# Patient Record
Sex: Female | Born: 1955
Health system: Southern US, Community
[De-identification: ages and names within clinical notes are randomized; demographics above are authoritative.]

## PROBLEM LIST (undated history)

## (undated) DIAGNOSIS — J45909 Unspecified asthma, uncomplicated: Secondary | ICD-10-CM

## (undated) DIAGNOSIS — R569 Unspecified convulsions: Secondary | ICD-10-CM

## (undated) DIAGNOSIS — T8859XA Other complications of anesthesia, initial encounter: Secondary | ICD-10-CM

## (undated) DIAGNOSIS — R002 Palpitations: Secondary | ICD-10-CM

## (undated) DIAGNOSIS — T4145XA Adverse effect of unspecified anesthetic, initial encounter: Secondary | ICD-10-CM

## (undated) HISTORY — DX: Palpitations: R00.2

## (undated) HISTORY — PX: TONSILLECTOMY: SUR1361

## (undated) HISTORY — PX: OTHER SURGICAL HISTORY: SHX169

## (undated) HISTORY — PX: CHOLECYSTECTOMY: SHX55

## (undated) HISTORY — PX: DILATION AND CURETTAGE OF UTERUS: SHX78

---

## 1998-01-01 ENCOUNTER — Other Ambulatory Visit: Admission: RE | Admit: 1998-01-01 | Discharge: 1998-01-01 | Payer: Self-pay | Admitting: Obstetrics and Gynecology

## 1998-12-17 ENCOUNTER — Other Ambulatory Visit: Admission: RE | Admit: 1998-12-17 | Discharge: 1998-12-17 | Payer: Self-pay | Admitting: Obstetrics and Gynecology

## 2000-01-04 ENCOUNTER — Other Ambulatory Visit: Admission: RE | Admit: 2000-01-04 | Discharge: 2000-01-04 | Payer: Self-pay | Admitting: Obstetrics and Gynecology

## 2000-07-13 ENCOUNTER — Encounter: Admission: RE | Admit: 2000-07-13 | Discharge: 2000-09-19 | Payer: Self-pay | Admitting: Obstetrics and Gynecology

## 2001-06-27 ENCOUNTER — Other Ambulatory Visit: Admission: RE | Admit: 2001-06-27 | Discharge: 2001-06-27 | Payer: Self-pay | Admitting: Obstetrics and Gynecology

## 2001-12-16 ENCOUNTER — Encounter: Payer: Self-pay | Admitting: Internal Medicine

## 2001-12-16 ENCOUNTER — Ambulatory Visit (HOSPITAL_COMMUNITY): Admission: RE | Admit: 2001-12-16 | Discharge: 2001-12-16 | Payer: Self-pay | Admitting: Internal Medicine

## 2001-12-17 ENCOUNTER — Inpatient Hospital Stay (HOSPITAL_COMMUNITY): Admission: EM | Admit: 2001-12-17 | Discharge: 2001-12-18 | Payer: Self-pay | Admitting: *Deleted

## 2001-12-24 ENCOUNTER — Emergency Department (HOSPITAL_COMMUNITY): Admission: EM | Admit: 2001-12-24 | Discharge: 2001-12-24 | Payer: Self-pay | Admitting: *Deleted

## 2002-01-15 ENCOUNTER — Ambulatory Visit (HOSPITAL_COMMUNITY): Admission: RE | Admit: 2002-01-15 | Discharge: 2002-01-15 | Payer: Self-pay | Admitting: Family Medicine

## 2002-02-17 ENCOUNTER — Emergency Department (HOSPITAL_COMMUNITY): Admission: EM | Admit: 2002-02-17 | Discharge: 2002-02-17 | Payer: Self-pay | Admitting: Emergency Medicine

## 2002-10-23 ENCOUNTER — Encounter: Payer: Self-pay | Admitting: Family Medicine

## 2002-10-23 ENCOUNTER — Ambulatory Visit (HOSPITAL_COMMUNITY): Admission: RE | Admit: 2002-10-23 | Discharge: 2002-10-23 | Payer: Self-pay | Admitting: Family Medicine

## 2002-11-20 ENCOUNTER — Other Ambulatory Visit: Admission: RE | Admit: 2002-11-20 | Discharge: 2002-11-20 | Payer: Self-pay | Admitting: Obstetrics and Gynecology

## 2004-02-09 ENCOUNTER — Other Ambulatory Visit: Admission: RE | Admit: 2004-02-09 | Discharge: 2004-02-09 | Payer: Self-pay | Admitting: Obstetrics and Gynecology

## 2007-06-13 ENCOUNTER — Emergency Department (HOSPITAL_COMMUNITY): Admission: EM | Admit: 2007-06-13 | Discharge: 2007-06-13 | Payer: Self-pay | Admitting: Emergency Medicine

## 2010-03-12 ENCOUNTER — Encounter: Payer: Self-pay | Admitting: Obstetrics and Gynecology

## 2010-03-14 ENCOUNTER — Encounter: Payer: Self-pay | Admitting: Obstetrics and Gynecology

## 2010-07-08 NOTE — Procedures (Signed)
   NAMEHAILLEE, JOHANN                              ACCOUNT NO.:  [id number]   MEDICAL RECORD NO.:  [id number]                   PATIENT TYPE:  OUT   LOCATION:  RESP                                 FACILITY:  [facility]   PHYSICIAN:  Edward L. Juanetta Gosling, M.D.             DATE OF BIRTH:  August 31, 1955   DATE OF PROCEDURE:  01/20/2002  DATE OF DISCHARGE:  01/15/2002                              PULMONARY FUNCTION TEST   IMPRESSION:  1. Spirometry is normal.  2. Lung volumes are normal.  3. Diffusion capacity of carbon monoxide is normal.                                               Edward L. Juanetta Gosling, M.D.    ELH/MEDQ  D:  01/20/2002  T:  01/21/2002  Job:  [id number]   cc:   Kirk Ruths, M.D.  [address]  [place]  [place] [address]  Fax: [phone]

## 2010-07-08 NOTE — Discharge Summary (Signed)
Felicia Bryant, Felicia Bryant                              ACCOUNT NO.:  [id number]   MEDICAL RECORD NO.:  [id number]                   PATIENT TYPE:  INP   LOCATION:  6524                                 FACILITY:  [facility]   PHYSICIAN:  Jackie Plum, M.D.             DATE OF BIRTH:  1955/04/26   DATE OF ADMISSION:  12/17/2001  DATE OF DISCHARGE:  12/18/2001                                 DISCHARGE SUMMARY   PRIMARY CARE PHYSICIAN:  Madelin Rear. Sherwood Gambler, M.D., in [place], [place]  [place].   DISCHARGE DIAGNOSIS:  Shortness of breath secondary to anxiety disorder;  controlled.   DISCHARGE MEDICATION:  Ativan 1 mg one half to one tablet p.o. t.i.d. p.r.n.  anxiety.   ALLERGIES:  KEFLEX and SULFA.   PROCEDURE:  None.   HISTORY OF PRESENT ILLNESS:  The patient is a 55 year old white female with  no significant past medical history who presented to the ER with shortness  of breath x3 weeks unable to take a deep breath.  Symptoms began three weeks  ago while walking and progressing to shortness of breath at rest, shortness  of breath not worse when lying down.  The patient states right foot swelling  during first episode which has now subsided.  On presentation that evening,  she felt she was going to die, the difficulty in breathing was so intense.  The patient states to be in excellent health and feels that this is not  related to underlying anxiety.  She reports history of mitral valve prolapse  with underlying palpitations but this episode is different.  No sustained  chest pain, diaphoresis, nausea.  The remainder of the review of systems is  negative.   HOSPITAL COURSE:  The patient was evaluated in the emergency department.  EKG showed normal sinus rhythm with no ectopy or acute ST changes.  Cardiac  enzymes x3 were negative on this admission.  Blood gas in the ER on room air  revealed pH 7.616, pCO2 21.9, pO2 100, bicarbonate 22.0, pCO2 23.0, 99%  saturated.  The patient was not  noted to be tachypneic during her evaluation  in the ER despite her hypocapnia.  Vital signs at the time of presentation  revealed blood pressure 138/78, pulse 95, respiratory rate 18, temperature  98.0, 100% saturated on room air.  Due to normal EKG and lack of any other  cardiac risk factors, the patient's shortness of breath was thought to be  unlikely related to cardiac cause.  She underwent spiral CT for evaluation  of PE.  CT results revealed no acute disease and no evidence of PE.  She  also underwent pulmonary function testing which showed an FVC of 3.31 being  90% of predicted, FEV1 of 2.84 at 101% of predicted, FEV 25 to 75% at 3.49  110% of predicted, FEF max 6.70 105% of predicted, FEF 25% 6.61 which is  114% of  predicted, FEF 50% 4.10 which is 92% of predicted, FEF 75% 1.58  which is 72% of predicted, and FEV1/FVC being 86 or 112% of predicted, MVV  68 which is 65% of predicted and test line for 12 seconds.  Spirometry was  found to be within normal limits and the patient did not undergo a post  bronchodilator study.  The patient did receive Ativan in the emergency  department which she states relieved her chest pain completely.  In light of  this, and the findings associated with her CT, EKG and pulmonary function  tests, it was thought the patient's chest discomfort was most likely  associated with anxiety disorder for which she has taken Xanax in the past.  Dr. Jeanie Sewer from psychiatry was consulted and evaluated the patient.  He  diagnosed her with an axis I diagnosis of panic disorder with mild  agoraphobia and prescribed Ativan as noted above.  The patient is to follow  up with [facility], appointment to be scheduled by social  worker prior to her discharge from this institution.   DISCHARGE LABORATORY DATA:  Other than as noted above, hemoglobin 15.0,  hematocrit 44.0.  Sodium 137, potassium 3.7, chloride 107, glucose 124, BUN  10.  BNP less than 30.   Urine pregnancy negative.  Creatinine 0.5.  Cholesterol 211, triglycerides 89, HDL 72, LDL 121, VLDL 18.   CONSULTATIONS:  Antonietta Breach, M.D., psychiatry.   CONDITION ON DISCHARGE:  Improved and stable.   DISPOSITION:  The patient was discharged to home.   FOLLOW UP:  The patient is to follow up with [facility],  appointment to be scheduled by social worker prior to her discharge from  [facility].      Ettrick. Christian Mate, M.D.    SMD/MEDQ  D:  12/18/2001  T:  12/19/2001  Job:  [id number]   cc:   Antonietta Breach, M.D.  [address]  [place], [place] [address]  Fax: [phone]   Madelin Rear. Sherwood Gambler, M.D.  [address]  [place]  [place] [address]  Fax: [phone]

## 2010-11-15 LAB — POCT CARDIAC MARKERS
CKMB, poc: <1 — ABNORMAL LOW
Myoglobin, poc: 57.1
Operator id: 270651

## 2010-11-15 LAB — POCT I-STAT, CHEM 8
Creatinine, Ser: 1
Hemoglobin: 13.9
Potassium: 3.7
Sodium: 139

## 2010-12-07 ENCOUNTER — Emergency Department (HOSPITAL_COMMUNITY)
Admission: EM | Admit: 2010-12-07 | Discharge: 2010-12-07 | Disposition: A | Discharge status: Home / Self Care | Payer: 59 | Attending: Emergency Medicine | Admitting: Emergency Medicine

## 2010-12-07 ENCOUNTER — Emergency Department (HOSPITAL_COMMUNITY): Payer: 59

## 2010-12-07 DIAGNOSIS — R000 Tachycardia, unspecified: Secondary | ICD-10-CM | POA: Insufficient documentation

## 2010-12-07 DIAGNOSIS — J40 Bronchitis, not specified as acute or chronic: Secondary | ICD-10-CM | POA: Insufficient documentation

## 2010-12-07 DIAGNOSIS — R002 Palpitations: Secondary | ICD-10-CM | POA: Insufficient documentation

## 2010-12-07 LAB — DIFFERENTIAL
Eosinophils Absolute: 0.2 10*3/uL (ref 0.0–0.7)
Eosinophils Relative: 3 % (ref 0–5)
Lymphs Abs: 1.9 10*3/uL (ref 0.7–4.0)
Monocytes Absolute: 0.4 10*3/uL (ref 0.1–1.0)
Monocytes Relative: 6 % (ref 3–12)

## 2010-12-07 LAB — CBC
HCT: 37.4 % (ref 36.0–46.0)
MCH: 28.5 pg (ref 26.0–34.0)
MCHC: 33.2 g/dL (ref 30.0–36.0)
MCV: 86 fL (ref 78.0–100.0)
Platelets: 332 10*3/uL (ref 150–400)
RDW: 13.1 % (ref 11.5–15.5)
WBC: 6.4 10*3/uL (ref 4.0–10.5)

## 2010-12-07 LAB — COMPREHENSIVE METABOLIC PANEL
AST: 19 U/L (ref 0–37)
Albumin: 4.2 g/dL (ref 3.5–5.2)
BUN: 14 mg/dL (ref 6–23)
Calcium: 10.1 mg/dL (ref 8.4–10.5)
Creatinine, Ser: 0.68 mg/dL (ref 0.50–1.10)
Total Protein: 7.7 g/dL (ref 6.0–8.3)

## 2010-12-07 LAB — POCT I-STAT TROPONIN I

## 2010-12-11 NOTE — Consult Note (Signed)
Felicia Bryant, Felicia Bryant                  ACCOUNT NO.:  [id number]  MEDICAL RECORD NO.:  [id number]  LOCATION:  [facility]                         FACILITY:  [facility]  PHYSICIAN:  Jake Bathe, MD      DATE OF BIRTH:  Sep 14, 1955  DATE OF CONSULTATION:  12/07/2010 DATE OF DISCHARGE:  12/07/2010                                CONSULTATION   REFERRING PHYSICIAN:  Nelva Nay, MD  REASON FOR CONSULTATION:  Consultation is at the request of Dr. Radford Pax for the evaluation of palpitations.  HISTORY OF PRESENT ILLNESS:  Ms. Felicia Bryant is a 55 year old female with prior history of PACs on Holter monitor from 2009, as well as mild diastolic dysfunction, who last night was having the sensation of palpitations, rapid heart beat.  She states that her pulse was going at 99 beats per minute.  She felt uneasy.  She denied any chest pain, shortness of breath, fevers, or chills.  She did have a mild cough and some mild trouble breathing with excess mucus in the nape of her throat. She called up to the office at [facility], and they told her to come to the emergency room for further evaluation.  Once in the emergency room, cardiac markers were drawn and were negative.  EKG was unremarkable showing sinus rhythm without any ST-segment changes and chest x-ray showed mild bronchitic changes.  White count is normal, creatinine is normal, hemoglobin is 12.4, and chest x-ray was personally reviewed.  Telemetry was also personally reviewed.  PAST MEDICAL HISTORY:  Prior palpitations as described above.  She did have a prolonged intubation at one point which she states that after that point she had heart issues or palpitations.  FAMILY HISTORY:  No early family history of CAD.  SOCIAL HISTORY:  Nonsmoker, nondrinker, and no drug use.  ALLERGIES:  BACTRIM and KEFLEX.  MEDICATIONS:  She does have an inhaler at home, most likely albuterol, otherwise no other meds.  REVIEW OF SYSTEMS:  No syncope, no chest pain, no  shortness of breath, no orthopnea, no rashes, no dysphasia.  Positive for palpitations as described above.  Unless specified above, all other 12 review of systems negative.  PHYSICAL EXAMINATION:  VITAL SIGNS:  Temperature 98.2, pulse 95, respirations 20 saturating 97% on room air, and blood pressure 145/90. GENERAL:  Alert and oriented x3 in acute distress, looking comfortable in the bed with her husband at bedside. EYES:  Well-perfused conjunctivae.  EOMI.  No scleral icterus. NECK:  Supple.  No lymphadenopathy.  No thyromegaly.  No carotid bruits. No JVD. CARDIOVASCULAR:  Regular rate and rhythm without any appreciable murmurs, rubs, or gallops (prior echo showed mild tricuspid regurgitation). ABDOMEN:  Soft, nontender, normoactive bowel sounds.  No rebound or guarding.  No tenderness. EXTREMITIES:  No clubbing, cyanosis, or edema.  Palpable distal pulses. GU:  Deferred. RECTAL:  Deferred. NEUROLOGIC:  Nonfocal. PSYCH:  Mildly anxious.  DATA:  Lab work as described above.  Chest x-ray as described above. Troponin negative.  ASSESSMENT/PLAN:  This is a 55 year old female with palpitations and bronchitis. 1. Palpitations - from prior Holter monitor from 2009, she had     demonstrated 700 premature atrial  contractions.  Benign arrhythmia.     No adverse arrhythmias identified.  No atrial fibrillation.  Most     likely, she was having some increased premature atrial contractions     as a result of increased mucus production/bronchitis.  No further     cardiac treatment warranted at this time.  If symptoms worsen, she     needs to contact me.  Prior ejection fraction reassuring. 2. Bronchitis - per Primary ED Team likely inhaler treatment.  She may follow up with her primary physician.  Her husband also sees me in the office.  Reassurance has been given to her.  Telemetry unremarkable.     Jake Bathe, MD     MCS/MEDQ  D:  12/07/2010  T:  12/07/2010  Job:   [id number]  Electronically Signed by Donato Schultz MD on 12/11/2010 07:56:11 AM

## 2012-10-12 ENCOUNTER — Encounter (HOSPITAL_COMMUNITY): Payer: Self-pay | Admitting: Emergency Medicine

## 2012-10-12 ENCOUNTER — Emergency Department (HOSPITAL_COMMUNITY): Payer: 59

## 2012-10-12 ENCOUNTER — Emergency Department (HOSPITAL_COMMUNITY)
Admission: EM | Admit: 2012-10-12 | Discharge: 2012-10-12 | Disposition: A | Discharge status: Home / Self Care | Payer: 59 | Attending: Emergency Medicine | Admitting: Emergency Medicine

## 2012-10-12 DIAGNOSIS — R238 Other skin changes: Secondary | ICD-10-CM | POA: Insufficient documentation

## 2012-10-12 DIAGNOSIS — R002 Palpitations: Secondary | ICD-10-CM

## 2012-10-12 DIAGNOSIS — IMO0002 Reserved for concepts with insufficient information to code with codable children: Secondary | ICD-10-CM | POA: Insufficient documentation

## 2012-10-12 DIAGNOSIS — J45909 Unspecified asthma, uncomplicated: Secondary | ICD-10-CM | POA: Insufficient documentation

## 2012-10-12 LAB — POCT I-STAT, CHEM 8
Calcium, Ion: 1.2 mmol/L (ref 1.12–1.23)
Creatinine, Ser: 0.9 mg/dL (ref 0.50–1.10)
Glucose, Bld: 106 mg/dL — ABNORMAL HIGH (ref 70–99)
HCT: 43 % (ref 36.0–46.0)
Hemoglobin: 14.6 g/dL (ref 12.0–15.0)
Potassium: 3.7 mEq/L (ref 3.5–5.1)

## 2012-10-12 LAB — RAPID URINE DRUG SCREEN, HOSP PERFORMED
Amphetamines: NOT DETECTED
Cocaine: NOT DETECTED
Opiates: NOT DETECTED
Tetrahydrocannabinol: NOT DETECTED

## 2012-10-12 LAB — TSH: TSH: 1.739 u[IU]/mL (ref 0.350–4.500)

## 2012-10-12 NOTE — ED Provider Notes (Signed)
CSN: [id number]     Arrival date & time 10/12/12  1050 History     First MD Initiated Contact with Patient 10/12/12 1101     Chief Complaint  Patient presents with  . Palpitations   (Consider location/radiation/quality/duration/timing/severity/associated sxs/prior Treatment) HPI  57 year old female presents complaining of heart palpitation. Patient states she has a history of asthma. She received steroid injection on occasion. Has never had a problem with it before. 2 weeks ago she received a shot of Depo-Medrol the doctor's office. Subsequently she has been experiencing intermittent bouts of heart palpitation, abnormal sensation throughout skin, and increased agitation. Symptom has been waxing and waning. It has improved however her heart palpitation was worsened last night which prompted to come to the ED today. Initially she was going to visit her Dr. but the office was busy, therefore decided to come to the ER today. She denies any fever, chills, headache, neck stiffness, vision changes, and shortness of breath, lightheadedness, dizziness, numbness weakness. She denies any rash. She denies any throat swelling or trouble swallowing, or trouble breathing. She has been seen by Dr. 2 weeks ago for this complaint. She was notified that the office has recently uses a new Depo-Medrol formulary.  Allis patient denies any significant history of heart problem, diabetes, a history of syncope. She is scheduled to follow with the cardiologist for further evaluations of the heart palpitation. Appointment is for September 8.  History reviewed. No pertinent past medical history. History reviewed. No pertinent past surgical history. History reviewed. No pertinent family history. History  Substance Use Topics  . Smoking status: Not on file  . Smokeless tobacco: Not on file  . Alcohol Use: No   OB History   Grav Para Term Preterm Abortions TAB SAB Ect Mult Living                 Review of Systems  All  other systems reviewed and are negative.    Allergies  Review of patient's allergies indicates not on file.  Home Medications  No current outpatient prescriptions on file. BP 116/75  Pulse 90  Temp(Src) 97.8 F (36.6 C) (Oral)  Resp 20  SpO2 99% Physical Exam  Nursing note and vitals reviewed. Constitutional: She is oriented to person, place, and time. She appears well-developed and well-nourished. No distress.  Awake, alert, nontoxic appearance  HENT:  Head: Atraumatic.  Mouth/Throat: Oropharynx is clear and moist.  Eyes: Conjunctivae are normal. Right eye exhibits no discharge. Left eye exhibits no discharge.  Neck: Neck supple. No tracheal deviation present. No thyromegaly present.  Cardiovascular: Normal rate, regular rhythm and intact distal pulses.  Exam reveals no gallop and no friction rub.   No murmur heard. Pulmonary/Chest: Effort normal. No respiratory distress. She exhibits no tenderness.  Abdominal: Soft. There is no tenderness. There is no rebound.  Musculoskeletal: She exhibits no edema and no tenderness.  ROM appears intact, no obvious focal weakness  Neurological: She is alert and oriented to person, place, and time.  Mental status and motor strength appears intact  Skin: No rash noted.  Psychiatric: She has a normal mood and affect.    ED Course   Procedures (including critical care time)   Date: 10/12/2012  Rate: 87  Rhythm: normal sinus rhythm  QRS Axis: normal  Intervals: normal  ST/T Wave abnormalities: normal  Conduction Disutrbances:none  Narrative Interpretation:   Old EKG Reviewed: none available    11:24 AM Pt here concerning for heart palpation and abnormal sensation  throughout her skin since receiving depo-medrol 3 weeks ago. She is non toxic in appearance, no active heart palpation or arrythmia. ECG normal.   No evidence of airway compromise or allergic reaction.  No evidence to suggest near syncope.  No active chest pain.  Care  discussed with attending.    12:51 PM EKG shows no acute changes. Chest x-rays is normal. Normal electrolytes, normal H&H, no active heart palpitation at this time. No evidence concerning for allergic reaction at this time. Patient is scheduled to follow up with her cardiologist, she may benefit from Holter monitoring outpatient. Patient will also follow with her primary care Dr. for further care. Patient states her recent lab work including electrolytes, and thyroid function panel has been normal. That was done a week ago.  Labs Reviewed  POCT I-STAT, CHEM 8 - Abnormal; Notable for the following:    Glucose, Bld 106 (*)    All other components within normal limits  TSH  URINE RAPID DRUG SCREEN (HOSP PERFORMED)   Dg Chest 2 View  10/12/2012   *RADIOLOGY REPORT*  Clinical Data: Palpitations.  Possible reaction to medication (Depo- Medrol).  CHEST - 2 VIEW  Comparison: Two-view chest x-ray 12/07/2010 and portable chest x- ray 06/13/2007.  Findings: The patient wanted to wear a thyroid shield during the examination, so the extreme medial lung apices and the upper mediastinum are excluded on the PA image.  Allowing for this, cardiomediastinal silhouette unremarkable and unchanged.  Lungs clear.  Bronchovascular markings normal.  Pulmonary vascularity normal.  No pneumothorax.  No pleural effusions.  Degenerative changes throughout the thoracic spine.  No significant interval change.  IMPRESSION: No acute cardiopulmonary disease.  Stable examination.   Original Report Authenticated By: Hulan Saas, M.D.   1. Heart palpitations     MDM  BP 116/75  Pulse 90  Temp(Src) 97.8 F (36.6 C) (Oral)  Resp 20  SpO2 99%  I have reviewed nursing notes and vital signs. I personally reviewed the imaging tests through PACS system  I reviewed available ER/hospitalization records thought the EMR    Fayrene Helper, [place] 10/12/12 1253

## 2012-10-12 NOTE — ED Notes (Signed)
Pt. Stated, i had a Depo-Medero, shot 3 weeks ago and ever since then, I've had my heart feel like its pounding and heat all over my body. My skin feels like its crawling all over.  The Dr. Ardelia Mems to see me today.

## 2012-10-12 NOTE — ED Provider Notes (Signed)
Medical screening examination/treatment/procedure(s) were performed by non-physician practitioner and as supervising physician I was immediately available for consultation/collaboration.   William Rhina Kramme, MD 10/12/12 1457 

## 2012-10-12 NOTE — ED Notes (Signed)
Pt states that she initially had a reaction at the beginning of August to Depo-medrol.  Pt did not seek treatment at that time.  Pt states that since then she has had intermittent episodes of feeling like her heart is racing and there is "acid in her veins".  Pt states she woke this morning at 4am with this feeling and sought treatment today.

## 2012-11-15 ENCOUNTER — Telehealth (HOSPITAL_COMMUNITY): Payer: Self-pay | Admitting: Dietician

## 2012-11-15 NOTE — Telephone Encounter (Signed)
Unable to fax notification. Valla Leaver at [phone] and informed her that pt has not been seen here.

## 2012-11-15 NOTE — Telephone Encounter (Signed)
There is no record of pt making an appointment to attend diabetes class. There is no record of pt attending diabetes class or nutrition appointment at [facility].

## 2012-11-15 NOTE — Telephone Encounter (Signed)
Faxed results to [facility].

## 2012-11-15 NOTE — Telephone Encounter (Signed)
Received fax from [facility] inquiring if pt has made an appointment or has been seen.

## 2013-02-18 ENCOUNTER — Emergency Department (HOSPITAL_COMMUNITY): Payer: 59

## 2013-02-18 ENCOUNTER — Encounter (HOSPITAL_COMMUNITY): Payer: Self-pay | Admitting: Emergency Medicine

## 2013-02-18 ENCOUNTER — Emergency Department (HOSPITAL_COMMUNITY)
Admission: EM | Admit: 2013-02-18 | Discharge: 2013-02-18 | Disposition: A | Discharge status: Home / Self Care | Payer: 59 | Attending: Emergency Medicine | Admitting: Emergency Medicine

## 2013-02-18 DIAGNOSIS — B9789 Other viral agents as the cause of diseases classified elsewhere: Secondary | ICD-10-CM

## 2013-02-18 DIAGNOSIS — J45901 Unspecified asthma with (acute) exacerbation: Secondary | ICD-10-CM | POA: Insufficient documentation

## 2013-02-18 DIAGNOSIS — J069 Acute upper respiratory infection, unspecified: Secondary | ICD-10-CM | POA: Insufficient documentation

## 2013-02-18 DIAGNOSIS — Z79899 Other long term (current) drug therapy: Secondary | ICD-10-CM | POA: Insufficient documentation

## 2013-02-18 HISTORY — DX: Unspecified asthma, uncomplicated: J45.909

## 2013-02-18 MED ORDER — IPRATROPIUM BROMIDE 0.02 % IN SOLN
0.5000 mg | Freq: Once | RESPIRATORY_TRACT | Status: AC
  Administered 2013-02-18: 0.5 mg via RESPIRATORY_TRACT
  Filled 2013-02-18: qty 2.5

## 2013-02-18 MED ORDER — BENZONATATE 100 MG PO CAPS
100.0000 mg | ORAL_CAPSULE | Freq: Once | ORAL | Status: AC
  Administered 2013-02-18: 100 mg via ORAL
  Filled 2013-02-18: qty 1

## 2013-02-18 MED ORDER — ALBUTEROL SULFATE (2.5 MG/3ML) 0.083% IN NEBU
5.0000 mg | INHALATION_SOLUTION | Freq: Once | RESPIRATORY_TRACT | Status: AC
  Administered 2013-02-18: 5 mg via RESPIRATORY_TRACT
  Filled 2013-02-18: qty 6

## 2013-02-18 MED ORDER — HYDROCOD POLST-CHLORPHEN POLST 10-8 MG/5ML PO LQCR
5.0000 mL | Freq: Once | ORAL | Status: DC
  Filled 2013-02-18: qty 5

## 2013-02-18 MED ORDER — BENZONATATE 100 MG PO CAPS: 100.0000 mg | ORAL_CAPSULE | Freq: Three times a day (TID) | ORAL | Status: DC

## 2013-02-18 NOTE — ED Notes (Signed)
Pt c/o URI sx and cough with body aches x 3 days; pt sts taking biaxin; pt sts hx of lung issues since sx several years ago

## 2013-02-18 NOTE — ED Notes (Signed)
Patient Declines Tussionex, states she is allergice to hydrocodone and codeine. These medications were added to pt's allergy list. EDPA made aware

## 2013-02-18 NOTE — ED Provider Notes (Signed)
CSN: [id number]     Arrival date & time 02/18/13  1828 History   First MD Initiated Contact with Patient 02/18/13 2007     Chief Complaint  Patient presents with  . URI  . Cough   (Consider location/radiation/quality/duration/timing/severity/associated sxs/prior Treatment) HPI History provided by pt.   Pt has had a cough for the past two days.  Keeping her awake at night.  Associated w/ chest congestion and SOB, that she describes as a mucous blockage of her airway when she is laying down.  She has also had subjective fever, chills, anorexia, body aches, fatigue, and nasal congestion.  She denies headache, rhinorrhea, sore throat, CP, exertional SOB, abd pain, N/V/D, urinary sx and rash.  Had similar sx earlier in the month, for which her doctor prescribed erythromycin over the phone.  At onset of current sx, he prescribed Levaquin, she took one dose but was unable to tolerate side effects, and he switched her to Biaxin today.  She has had one dose of this medication.  Has a h/o asthma.  Albuterol nebs seem to break up mucous in chest but have no improved her cough. Past Medical History  Diagnosis Date  . Asthma    History reviewed. No pertinent past surgical history. History reviewed. No pertinent family history. History  Substance Use Topics  . Smoking status: Never Smoker   . Smokeless tobacco: Not on file  . Alcohol Use: No   OB History   Grav Para Term Preterm Abortions TAB SAB Ect Mult Living                 Review of Systems  All other systems reviewed and are negative.    Allergies  Ciprofloxacin; Depo-medrol; and Keflex  Home Medications   Current Outpatient Rx  Name  Route  Sig  Dispense  Refill  . albuterol (PROVENTIL HFA;VENTOLIN HFA) 108 (90 BASE) MCG/ACT inhaler   Inhalation   Inhale 2 puffs into the lungs every 6 (six) hours as needed for wheezing.         Marland Kitchen albuterol (PROVENTIL) (5 MG/ML) 0.5% nebulizer solution   Nebulization   Take 2.5 mg by  nebulization every 6 (six) hours as needed for wheezing.         . clobetasol ointment (TEMOVATE) 0.05 %   Topical   Apply 1 application topically 2 (two) times daily.          BP 114/85  Pulse 124  Temp(Src) 99.6 F (37.6 C) (Oral)  Resp 18  Wt 199 lb 5 oz (90.408 kg)  SpO2 97% Physical Exam  Nursing note and vitals reviewed. Constitutional: She is oriented to person, place, and time. She appears well-developed and well-nourished. No distress.  HENT:  Head: Normocephalic and atraumatic.  Mouth/Throat: Oropharynx is clear and moist. No oropharyngeal exudate.  Eyes:  Normal appearance  Neck: Normal range of motion. Neck supple.  Cardiovascular: Normal rate and regular rhythm.   Pulmonary/Chest: Effort normal and breath sounds normal.  Patient w/out respiratory distress.  Speaking in full sentences.  Coughing.   Abdominal: Soft. Bowel sounds are normal. She exhibits no distension. There is no tenderness.  Musculoskeletal: Normal range of motion.  No peripheral edema or calf ttp  Lymphadenopathy:    She has no cervical adenopathy.  Neurological: She is alert and oriented to person, place, and time.  Skin: Skin is warm and dry. No rash noted.  Psychiatric: She has a normal mood and affect. Her behavior is normal.  ED Course  Procedures (including critical care time) Labs Review Labs Reviewed - No data to display Imaging Review Dg Chest 2 View (if Patient Has Fever And/or Copd)  02/18/2013   CLINICAL DATA:  Chronic cough for 3 weeks  EXAM: CHEST  2 VIEW  COMPARISON:  10/12/2012  FINDINGS: The heart size and mediastinal contours are within normal limits. Both lungs are clear. The visualized skeletal structures are unremarkable.  IMPRESSION: No active cardiopulmonary disease.   Electronically Signed   By: Esperanza Heir M.D.   On: 02/18/2013 19:45    EKG Interpretation   None       MDM   1. Viral respiratory illness    57yo F w/ asthma presents w/ respiratory  illness x 2 days.  Cough has been associated w/ SOB, that she describes as mucous blocking her airway when she is supine.  CXR neg for pna.  On exam, afebrile, non-toxic appearing, no respiratory distress, coughing, diffuse inspiratory/expiratory rhonchi.  Will treat w/ tessalon perles and a breathing treatment and then ambulate on pulse ox.  9:05 PM   Pt reports that her cough has improved and it feels as though her chest has opened up.  Repeat exam stable.  Nursing staff ambulated.  Pt did not become dyspneic, talked throughout exam, and O2 sat remained >95%.  She likely has a viral respiratory illness.  D/c'd home w/ tessalon perles.  She has an albuterol neb that she has not been using appropriately, which is likely why it has given her little relief.  Recommended rest, fluids and f/u w/ PCP and return for worsening sx.   Otilio Miu, PA-C 02/18/13 [phone]

## 2013-02-18 NOTE — ED Notes (Signed)
Respiratory at bedside.

## 2013-02-23 NOTE — ED Provider Notes (Signed)
Medical screening examination/treatment/procedure(s) were performed by non-physician practitioner and as supervising physician I was immediately available for consultation/collaboration.  EKG Interpretation   None         Orpah Greek, MD 02/23/13 [phone]

## 2014-06-11 ENCOUNTER — Emergency Department (HOSPITAL_COMMUNITY)
Admission: EM | Admit: 2014-06-11 | Discharge: 2014-06-11 | Disposition: A | Discharge status: Home / Self Care | Payer: 59 | Attending: Emergency Medicine | Admitting: Emergency Medicine

## 2014-06-11 ENCOUNTER — Encounter (HOSPITAL_COMMUNITY): Payer: Self-pay | Admitting: Emergency Medicine

## 2014-06-11 ENCOUNTER — Emergency Department (HOSPITAL_COMMUNITY): Payer: 59

## 2014-06-11 DIAGNOSIS — R002 Palpitations: Secondary | ICD-10-CM | POA: Insufficient documentation

## 2014-06-11 DIAGNOSIS — Z79899 Other long term (current) drug therapy: Secondary | ICD-10-CM | POA: Insufficient documentation

## 2014-06-11 DIAGNOSIS — J45909 Unspecified asthma, uncomplicated: Secondary | ICD-10-CM | POA: Diagnosis not present

## 2014-06-11 LAB — URINALYSIS, ROUTINE W REFLEX MICROSCOPIC
Bilirubin Urine: NEGATIVE
GLUCOSE, UA: NEGATIVE mg/dL
KETONES UR: 15 mg/dL — AB
Leukocytes, UA: NEGATIVE
Nitrite: NEGATIVE
PROTEIN: NEGATIVE mg/dL
Specific Gravity, Urine: 1.012 (ref 1.005–1.030)
Urobilinogen, UA: 0.2 mg/dL (ref 0.0–1.0)
pH: 6.5 (ref 5.0–8.0)

## 2014-06-11 LAB — URINE MICROSCOPIC-ADD ON

## 2014-06-11 LAB — I-STAT CHEM 8, ED
BUN: 14 mg/dL (ref 6–23)
CHLORIDE: 105 mmol/L (ref 96–112)
Calcium, Ion: 1.15 mmol/L (ref 1.12–1.23)
Creatinine, Ser: 0.8 mg/dL (ref 0.50–1.10)
GLUCOSE: 123 mg/dL — AB (ref 70–99)
HEMATOCRIT: 42 % (ref 36.0–46.0)
Hemoglobin: 14.3 g/dL (ref 12.0–15.0)
POTASSIUM: 3.6 mmol/L (ref 3.5–5.1)
Sodium: 139 mmol/L (ref 135–145)
TCO2: 20 mmol/L (ref 0–100)

## 2014-06-11 LAB — I-STAT TROPONIN, ED: TROPONIN I, POC: 0 ng/mL (ref 0.00–0.08)

## 2014-06-11 NOTE — ED Notes (Signed)
Jen, PA-C, at the bedside.  

## 2014-06-11 NOTE — Discharge Instructions (Signed)
Please follow up with your primary care physician in 1-2 days. If you do not have one please call the [place] number listed above. Please discontinue use of the vitamins. Please follow up with Dr. Marlou Porch to schedule a follow up appointment. Please read all discharge instructions and return precautions.   Palpitations A palpitation is the feeling that your heartbeat is irregular or is faster than normal. It may feel like your heart is fluttering or skipping a beat. Palpitations are usually not a serious problem. However, in some cases, you may need further medical evaluation. CAUSES  Palpitations can be caused by:  Smoking.  Caffeine or other stimulants, such as diet pills or energy drinks.  Alcohol.  Stress and anxiety.  Strenuous physical activity.  Fatigue.  Certain medicines.  Heart disease, especially if you have a history of irregular heart rhythms (arrhythmias), such as atrial fibrillation, atrial flutter, or supraventricular tachycardia.  An improperly working pacemaker or defibrillator. DIAGNOSIS  To find the cause of your palpitations, your health care provider will take your medical history and perform a physical exam. Your health care provider may also have you take a test called an ambulatory electrocardiogram (ECG). An ECG records your heartbeat patterns over a 24-hour period. You may also have other tests, such as:  Transthoracic echocardiogram (TTE). During echocardiography, sound waves are used to evaluate how blood flows through your heart.  Transesophageal echocardiogram (TEE).  Cardiac monitoring. This allows your health care provider to monitor your heart rate and rhythm in real time.  Holter monitor. This is a portable device that records your heartbeat and can help diagnose heart arrhythmias. It allows your health care provider to track your heart activity for several days, if needed.  Stress tests by exercise or by giving medicine that  makes the heart beat faster. TREATMENT  Treatment of palpitations depends on the cause of your symptoms and can vary greatly. Most cases of palpitations do not require any treatment other than time, relaxation, and monitoring your symptoms. Other causes, such as atrial fibrillation, atrial flutter, or supraventricular tachycardia, usually require further treatment. HOME CARE INSTRUCTIONS   Avoid:  Caffeinated coffee, tea, soft drinks, diet pills, and energy drinks.  Chocolate.  Alcohol.  Stop smoking if you smoke.  Reduce your stress and anxiety. Things that can help you relax include:  A method of controlling things in your body, such as your heartbeats, with your mind (biofeedback).  Yoga.  Meditation.  Physical activity such as swimming, jogging, or walking.  Get plenty of rest and sleep. SEEK MEDICAL CARE IF:   You continue to have a fast or irregular heartbeat beyond 24 hours.  Your palpitations occur more often. SEEK IMMEDIATE MEDICAL CARE IF:  You have chest pain or shortness of breath.  You have a severe headache.  You feel dizzy or you faint. MAKE SURE YOU:  Understand these instructions.  Will watch your condition.  Will get help right away if you are not doing well or get worse. Document Released: 02/04/2000 Document Revised: 02/11/2013 Document Reviewed: 04/07/2011 [facility] Patient Information 2015 [place], [place]. This information is not intended to replace advice given to you by your health care provider. Make sure you discuss any questions you have with your health care provider.

## 2014-06-11 NOTE — ED Notes (Signed)
ekg given to Dr. Colin Rhein

## 2014-06-11 NOTE — ED Provider Notes (Signed)
CSN: [id number]     Arrival date & time 06/11/14  0404 History   First MD Initiated Contact with Patient 06/11/14 [phone]     Chief Complaint  Patient presents with  . Palpitations     (Consider location/radiation/quality/duration/timing/severity/associated sxs/prior Treatment) HPI Comments: Patient states she noticed her heart started racing approximately 10 days ago, during stress. And she reports starting a new herbal medication. Patient endorses this feels just like her previous palpitations, saw Dr. Marlou Porch for those. Patient wore a holter monitor and was told she had PACs.   Patient is a 59 y.o. female presenting with palpitations. The history is provided by the patient.  Palpitations Palpitations quality:  Fast Onset quality:  Sudden Duration:  10 days Timing:  Sporadic Progression:  Worsening Chronicity:  Recurrent Context: not appetite suppressants, not caffeine, not illicit drugs and not stimulant use   Context comment:  Herbal vitamins Relieved by: exercise. Worsened by:  Nothing Ineffective treatments:  None tried Associated symptoms: cough   Associated symptoms: no chest pain, no chest pressure, no diaphoresis, no dizziness, no hemoptysis, no leg pain, no lower extremity edema, no nausea, no near-syncope, no orthopnea, no PND, no shortness of breath, no syncope, no vomiting and no weakness   Risk factors: no diabetes mellitus, no heart disease, no hx of atrial fibrillation, no hx of DVT, no hx of PE, no hx of thyroid disease, no hypercoagulable state, no hyperthyroidism, no OTC sinus medications and no stress     Past Medical History  Diagnosis Date  . Asthma   . Palpitations     seen Dr.Skains in 2009..holter with pac's  . Palpitations     echocardiogram in 6629 with diastolic dysfunction EF 57 %   Past Surgical History  Procedure Laterality Date  . Tonsillectomy    . Colon surgery    . Cholecystectomy     History reviewed. No pertinent family history. History   Substance Use Topics  . Smoking status: Never Smoker   . Smokeless tobacco: Not on file  . Alcohol Use: No   OB History    No data available     Review of Systems  Constitutional: Negative for diaphoresis.  Respiratory: Positive for cough. Negative for hemoptysis and shortness of breath.   Cardiovascular: Positive for palpitations. Negative for chest pain, orthopnea, leg swelling, syncope, PND and near-syncope.  Gastrointestinal: Negative for nausea and vomiting.  Neurological: Negative for dizziness and weakness.  All other systems reviewed and are negative.     Allergies  Ciprofloxacin; Codeine; Depo-medrol; Iodine; Levaquin; Percocet; Bactrim; and Keflex  Home Medications   Prior to Admission medications   Medication Sig Start Date End Date Taking? Authorizing Provider  albuterol (PROVENTIL HFA;VENTOLIN HFA) 108 (90 BASE) MCG/ACT inhaler Inhale 2 puffs into the lungs every 6 (six) hours as needed for wheezing.   Yes Historical Provider, MD  albuterol (PROVENTIL) (5 MG/ML) 0.5% nebulizer solution Take 2.5 mg by nebulization every 6 (six) hours as needed for wheezing.   Yes Historical Provider, MD  benzonatate (TESSALON) 100 MG capsule Take 1 capsule (100 mg total) by mouth every 8 (eight) hours. 02/18/13   Catherine Schinlever, PA-C   BP 122/76 mmHg  Pulse 75  Temp(Src) 97.7 F (36.5 C) (Oral)  Resp 15  Ht 5\' 5"  (1.651 m)  Wt 202 lb (91.627 kg)  BMI 33.61 kg/m2  SpO2 99% Physical Exam  Constitutional: She is oriented to person, place, and time. She appears well-developed and well-nourished. No distress.  HENT:  Head: Normocephalic and atraumatic.  Right Ear: External ear normal.  Left Ear: External ear normal.  Nose: Nose normal.  Mouth/Throat: Oropharynx is clear and moist. No oropharyngeal exudate.  Eyes: Conjunctivae are normal.  Neck: Normal range of motion. Neck supple.  No nuchal rigidity.   Cardiovascular: Normal rate, regular rhythm, normal heart sounds  and intact distal pulses.   No murmur heard. Pulmonary/Chest: Effort normal and breath sounds normal. No respiratory distress. She exhibits no tenderness.  Abdominal: Soft. There is no tenderness.  Musculoskeletal: Normal range of motion. She exhibits no edema.  Neurological: She is alert and oriented to person, place, and time.  Skin: Skin is warm and dry. She is not diaphoretic.  Psychiatric: She has a normal mood and affect.  Nursing note and vitals reviewed.   ED Course  Procedures (including critical care time) Medications - No data to display  Labs Review Labs Reviewed  URINALYSIS, ROUTINE W REFLEX MICROSCOPIC - Abnormal; Notable for the following:    Hgb urine dipstick TRACE (*)    Ketones, ur 15 (*)    All other components within normal limits  URINE MICROSCOPIC-ADD ON - Abnormal; Notable for the following:    Squamous Epithelial / LPF FEW (*)    Bacteria, UA MANY (*)    All other components within normal limits  I-STAT CHEM 8, ED - Abnormal; Notable for the following:    Glucose, Bld 123 (*)    All other components within normal limits  URINE CULTURE  I-STAT TROPOININ, ED    Imaging Review Dg Chest 2 View  06/11/2014   CLINICAL DATA:  Palpitations.  History of asthma  EXAM: CHEST  2 VIEW  COMPARISON:  02/18/2013  FINDINGS: Borderline cardiomegaly, with size likely accentuated by lower mediastinal fat pad and hypoventilation. Normal aortic and hilar contours. There is no edema, consolidation, effusion, or pneumothorax. Cholecystectomy.  No acute bony findings.  IMPRESSION: No active cardiopulmonary disease.   Electronically Signed   By: Monte Fantasia M.D.   On: 06/11/2014 04:52     EKG Interpretation   Date/Time:  Thursday June 11 2014 04:14:52 EDT Ventricular Rate:  81 PR Interval:  202 QRS Duration: 87 QT Interval:  386 QTC Calculation: 448 R Axis:   30 Text Interpretation:  Sinus rhythm Borderline prolonged PR interval Low  voltage, precordial leads No  significant change since last tracing  Confirmed by HORTON  MD, COURTNEY ([id number]) on 06/11/2014 5:16:09 AM      MDM   Final diagnoses:  Heart palpitations    Filed Vitals:   06/11/14 0530  BP: 122/76  Pulse: 75  Temp: 97.7 F (36.5 C)  Resp: 15   Afebrile, NAD, non-toxic appearing, AAOx4.  I have reviewed nursing notes, vital signs, and all appropriate lab and imaging results for this patient.  Patient is to be discharged with recommendation to follow up with PCP and cardiologist in regards to today's hospital visit. VSS, no tracheal deviation, no JVD or new murmur, RRR, breath sounds equal bilaterally, EKG without acute abnormalities, negative troponin, and negative CXR. No evidence of UTI. Pt has been advised to discontinue use of OTC herbal medications and decrease use of caffeine products and return to the ED is CP becomes exertional, associated with diaphoresis or nausea, radiates to left jaw/arm, worsens or becomes concerning in any way. Pt appears reliable for follow up and is agreeable to discharge. Patient is stable at time of discharge   Baron Sane, PA-C 06/11/14 [place]  Gwinda Passe, MD 06/11/14 2037

## 2014-06-11 NOTE — ED Notes (Signed)
Patient states she noticed her heart started racing approximately 10 days ago, during stress. And she reports starting a new herbal medication. Patient is currently alert and oriented, heart rate on telemetry monitor 87. Skin normal for ethnicity.

## 2014-06-11 NOTE — ED Notes (Signed)
Patient attempting to void with EMT assist.

## 2014-06-12 LAB — URINE CULTURE
Colony Count: NO GROWTH
Culture: NO GROWTH

## 2014-06-24 ENCOUNTER — Ambulatory Visit (INDEPENDENT_AMBULATORY_CARE_PROVIDER_SITE_OTHER): Payer: 59 | Admitting: Cardiology

## 2014-06-24 ENCOUNTER — Encounter: Payer: Self-pay | Admitting: Cardiology

## 2014-06-24 VITALS — BP 110/80 | HR 82 | Ht 65.0 in | Wt 198.0 lb

## 2014-06-24 DIAGNOSIS — R002 Palpitations: Secondary | ICD-10-CM | POA: Diagnosis not present

## 2014-06-24 NOTE — Patient Instructions (Signed)
Medication Instructions:  Your physician recommends that you continue on your current medications as directed. Please refer to the Current Medication list given to you today.  Labwork: None  Testing/Procedures: Your physician has requested that you have an echocardiogram. Echocardiography is a painless test that uses sound waves to create images of your heart. It provides your doctor with information about the size and shape of your heart and how well your heart's chambers and valves are working. This procedure takes approximately one hour. There are no restrictions for this procedure.  Follow-Up: Further follow up will be based on the results of the echocardiogram.  Thank you for choosing [facility]!!

## 2014-06-24 NOTE — Progress Notes (Signed)
Cardiology Office Note   Date:  06/24/2014   ID:  Felicia Bryant, DOB 04/13/1955, MRN [id number]  PCP:  Alonza Bogus, MD  Cardiologist:   Candee Furbish, MD       History of Present Illness: Felicia Bryant is a 59 y.o. female who presents for  Here for evaluation of palpitations. Her heart felt as though it began racing in mid April about 10 days prior to her urgent care visit on 06/11/14 during stressful situation. She tried a new herbal medication. Telemetry in urgent care was normal at heart rate of 87. EKG was unremarkable. Herbal medication was stopped. Chest x-ray was unremarkable. Hemoglobin 14. Potassium 3.6. Creatinine 0.8. She saw me in 2014 after having some breathing difficulties. Prior echocardiogram in 2009 was reassuring with normal ejection fraction.  She has felt much better since hydrating herself. No syncope. No chest pain.    Past Medical History  Diagnosis Date  . Asthma   . Palpitations     seen Dr.Skains in 2009..holter with pac's  . Palpitations     echocardiogram in 0865 with diastolic dysfunction EF 57 %    Past Surgical History  Procedure Laterality Date  . Tonsillectomy    . Colon surgery    . Cholecystectomy       Current Outpatient Prescriptions  Medication Sig Dispense Refill  . albuterol (PROVENTIL HFA;VENTOLIN HFA) 108 (90 BASE) MCG/ACT inhaler Inhale 2 puffs into the lungs every 6 (six) hours as needed for wheezing.    [place] Kitchen albuterol (PROVENTIL) (5 MG/ML) 0.5% nebulizer solution Take 2.5 mg by nebulization every 6 (six) hours as needed for wheezing.    . benzonatate (TESSALON) 100 MG capsule Take 1 capsule (100 mg total) by mouth every 8 (eight) hours. 20 capsule 0   No current facility-administered medications for this visit.    Allergies:   Ciprofloxacin; Codeine; Depo-medrol; Iodine; Levaquin; Percocet; Bactrim; and Keflex    Social History:  The patient  reports that she has never smoked. She does not have any smokeless tobacco history  on file. She reports that she does not drink alcohol or use illicit drugs.   Family History:  The patient's no early family history of coronary artery disease    ROS:  Please see the history of present illness.   Otherwise, review of systems are positive for none.   All other systems are reviewed and negative.    PHYSICAL EXAM: VS:  BP 110/80 mmHg  Pulse 82  Ht 5\' 5"  (1.651 m)  Wt 198 lb (89.812 kg)  BMI 32.95 kg/m2 , BMI Body mass index is 32.95 kg/(m^2). GEN: Well nourished, well developed, in no acute distress HEENT: normal Neck: no JVD, carotid bruits, or masses Cardiac: RRR; no murmurs, rubs, or gallops,no edema  Respiratory:  clear to auscultation bilaterally, normal work of breathing GI: soft, nontender, nondistended, + BS MS: no deformity or atrophy Skin: warm and dry, no rash Neuro:  Strength and sensation are intact Psych: euthymic mood, full affect   EKG:  EKG is ordered today. The ekg ordered today demonstrates 06/24/14-sinus rhythm, 82, no other abnormalities.   Recent Labs: 06/11/2014: BUN 14; Creatinine 0.80; Hemoglobin 14.3; Potassium 3.6; Sodium 139    Lipid Panel No results found for: CHOL, TRIG, HDL, CHOLHDL, VLDL, LDLCALC, LDLDIRECT    Wt Readings from Last 3 Encounters:  06/24/14 198 lb (89.812 kg)  06/11/14 202 lb (91.627 kg)  02/18/13 199 lb 5 oz (90.408 kg)  Other studies Reviewed: Additional studies/ records that were reviewed today include: Emergency room reports, personally viewed chest x-ray and showed to patient. Lab work.. Review of the above records demonstrates: As above   ASSESSMENT AND PLAN:  1.  Palpitations-certainly supplements could have contributed to her palpitations. Now that she is hydrating well, she is feeling less of these. She will occasionally still feel her palpitations however. No syncope. No chest pain. I will go ahead and order an echocardiogram to insure proper structure and function. There was also mention of  borderline cardiomegaly on most recent chest x-ray which is a change from prior. I think that this is mostly because of decreased inspiratory volume during chest x-ray versus actual cardiomegaly. Echocardiogram will help to confirm this.  2. Asthma-overall doing very well. She does not do well on airplanes. 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  Working with Dr. Marina Gravel.    Weight loss-would encourage her raw diet.  Current medicines are reviewed at length with the patient today.  The patient does not have concerns regarding medicines.  The following changes have been made:  no change  Labs/ tests ordered today include:   Orders Placed This Encounter  Procedures  . EKG 12-Lead  . Echocardiogram     Disposition:   FU with me on as-needed basis   Signed, Candee Furbish, MD  06/24/2014 3:40 PM    [facility] [address], [place], [place]  [address] Phone: [phone]; Fax: [phone]

## 2014-06-25 ENCOUNTER — Ambulatory Visit: Payer: 59 | Admitting: Cardiology

## 2014-06-30 ENCOUNTER — Ambulatory Visit (HOSPITAL_COMMUNITY): Payer: 59 | Attending: Cardiovascular Disease

## 2014-06-30 ENCOUNTER — Other Ambulatory Visit: Payer: Self-pay

## 2014-06-30 DIAGNOSIS — J45909 Unspecified asthma, uncomplicated: Secondary | ICD-10-CM | POA: Diagnosis not present

## 2014-06-30 DIAGNOSIS — R002 Palpitations: Secondary | ICD-10-CM | POA: Diagnosis not present

## 2014-08-05 ENCOUNTER — Ambulatory Visit: Payer: 59 | Admitting: Cardiology

## 2014-08-21 ENCOUNTER — Encounter (HOSPITAL_COMMUNITY): Payer: Self-pay | Admitting: Family Medicine

## 2014-08-21 ENCOUNTER — Emergency Department (HOSPITAL_COMMUNITY)
Admission: EM | Admit: 2014-08-21 | Discharge: 2014-08-21 | Disposition: A | Discharge status: Home / Self Care | Payer: 59 | Attending: Emergency Medicine | Admitting: Emergency Medicine

## 2014-08-21 DIAGNOSIS — J45909 Unspecified asthma, uncomplicated: Secondary | ICD-10-CM | POA: Insufficient documentation

## 2014-08-21 DIAGNOSIS — Z87440 Personal history of urinary (tract) infections: Secondary | ICD-10-CM | POA: Insufficient documentation

## 2014-08-21 DIAGNOSIS — Z79899 Other long term (current) drug therapy: Secondary | ICD-10-CM | POA: Insufficient documentation

## 2014-08-21 DIAGNOSIS — R112 Nausea with vomiting, unspecified: Secondary | ICD-10-CM | POA: Insufficient documentation

## 2014-08-21 DIAGNOSIS — R300 Dysuria: Secondary | ICD-10-CM

## 2014-08-21 LAB — BASIC METABOLIC PANEL
Anion gap: 14 (ref 5–15)
BUN: 9 mg/dL (ref 6–20)
CO2: 20 mmol/L — ABNORMAL LOW (ref 22–32)
CREATININE: 0.88 mg/dL (ref 0.44–1.00)
Calcium: 9 mg/dL (ref 8.9–10.3)
Chloride: 100 mmol/L — ABNORMAL LOW (ref 101–111)
GFR calc Af Amer: >60 mL/min (ref >60)
GFR calc non Af Amer: >60 mL/min (ref >60)
Glucose, Bld: 137 mg/dL — ABNORMAL HIGH (ref 65–99)
Potassium: 3.5 mmol/L (ref 3.5–5.1)
Sodium: 134 mmol/L — ABNORMAL LOW (ref 135–145)

## 2014-08-21 LAB — URINALYSIS, ROUTINE W REFLEX MICROSCOPIC
Bilirubin Urine: NEGATIVE
GLUCOSE, UA: NEGATIVE mg/dL
Ketones, ur: >80 mg/dL — AB
Leukocytes, UA: NEGATIVE
Nitrite: NEGATIVE
Protein, ur: NEGATIVE mg/dL
SPECIFIC GRAVITY, URINE: 1.014 (ref 1.005–1.030)
Urobilinogen, UA: 0.2 mg/dL (ref 0.0–1.0)
pH: 5 (ref 5.0–8.0)

## 2014-08-21 LAB — CBC WITH DIFFERENTIAL/PLATELET
Basophils Absolute: 0 10*3/uL (ref 0.0–0.1)
Basophils Relative: 0 % (ref 0–1)
EOS PCT: 0 % (ref 0–5)
Eosinophils Absolute: 0 10*3/uL (ref 0.0–0.7)
HCT: 39.3 % (ref 36.0–46.0)
Hemoglobin: 13.3 g/dL (ref 12.0–15.0)
LYMPHS ABS: 0.4 10*3/uL — AB (ref 0.7–4.0)
LYMPHS PCT: 4 % — AB (ref 12–46)
MCH: 29.2 pg (ref 26.0–34.0)
MCHC: 33.8 g/dL (ref 30.0–36.0)
MCV: 86.2 fL (ref 78.0–100.0)
Monocytes Absolute: 0.3 10*3/uL (ref 0.1–1.0)
Monocytes Relative: 3 % (ref 3–12)
NEUTROS ABS: 7.7 10*3/uL (ref 1.7–7.7)
Neutrophils Relative %: 93 % — ABNORMAL HIGH (ref 43–77)
Platelets: 267 10*3/uL (ref 150–400)
RBC: 4.56 MIL/uL (ref 3.87–5.11)
RDW: 12.9 % (ref 11.5–15.5)
WBC: 8.4 10*3/uL (ref 4.0–10.5)

## 2014-08-21 LAB — URINE MICROSCOPIC-ADD ON

## 2014-08-21 MED ORDER — SODIUM CHLORIDE 0.9 % IV BOLUS (SEPSIS)
1000.0000 mL | Freq: Once | INTRAVENOUS | Status: AC
  Administered 2014-08-21: 1000 mL via INTRAVENOUS

## 2014-08-21 MED ORDER — ONDANSETRON HCL 4 MG/2ML IJ SOLN
4.0000 mg | Freq: Once | INTRAMUSCULAR | Status: AC
  Administered 2014-08-21: 4 mg via INTRAVENOUS
  Filled 2014-08-21: qty 2

## 2014-08-21 MED ORDER — PHENAZOPYRIDINE HCL 200 MG PO TABS: 200.0000 mg | ORAL_TABLET | Freq: Three times a day (TID) | ORAL | Status: DC

## 2014-08-21 MED ORDER — ONDANSETRON 4 MG PO TBDP: 4.0000 mg | ORAL_TABLET | Freq: Three times a day (TID) | ORAL | Status: DC | PRN

## 2014-08-21 MED ORDER — ACETAMINOPHEN 650 MG RE SUPP
650.0000 mg | Freq: Once | RECTAL | Status: AC
  Administered 2014-08-21: 650 mg via RECTAL
  Filled 2014-08-21: qty 1

## 2014-08-21 MED ORDER — KETOROLAC TROMETHAMINE 30 MG/ML IJ SOLN
30.0000 mg | Freq: Once | INTRAMUSCULAR | Status: DC
  Filled 2014-08-21: qty 1

## 2014-08-21 NOTE — ED Notes (Signed)
Pt here for vomiting. Currently being treated for UTI; started antibiotic(Macrobid) last night. After taking it felt nauseated and numb in the hands, feet, and face.

## 2014-08-21 NOTE — Discharge Instructions (Signed)
Recommend to stop your antibiotics at this time. Take the prescribed medication as directed.  Pyridium will help with dysuria (burning sensation) but it will turn your urine orange/red which is normal. Urine has been sent for culture, you will be contacted if results are abnormal. Make sure to drink plenty of fluids.  May need to start with bland diet and progress back to normal as tolerated. Follow-up with your primary care physician. Return to the ED for new or worsening symptoms.

## 2014-08-21 NOTE — ED Provider Notes (Signed)
CSN: [id number]     Arrival date & time 08/21/14  1032 History   First MD Initiated Contact with Patient 08/21/14 1036     Chief Complaint  Patient presents with  . Urinary Tract Infection     (Consider location/radiation/quality/duration/timing/severity/associated sxs/prior Treatment) Patient is a 59 y.o. female presenting with urinary tract infection. The history is provided by the patient and medical records.  Urinary Tract Infection Associated symptoms: nausea and vomiting     This is a 59 year old female with past medical history significant for asthma, palpitations, presenting to the ED for nausea and vomiting. Patient states she is currently being treated for a UTI with Macrobid. She states after taking the first dose of medication she had some mild nausea but thought it was because she did on empty stomach. States she ate some green beans this morning with her second dose of medication and began vomiting afterwards.  She continues to endorse some dysuria. No hematuria. No abdominal or flank pain. Endorses chills and subjective fever.  Patient has never taken macrobid in the past.  Does have known allergies to ciprofloxacin, levofloxacin, codeine, bactrim, keflex.  VSS.  Past Medical History  Diagnosis Date  . Asthma   . Palpitations     seen Dr.Skains in 2009..holter with pac's  . Palpitations     echocardiogram in 1607 with diastolic dysfunction EF 57 %   Past Surgical History  Procedure Laterality Date  . Tonsillectomy    . Cholecystectomy     Family History  Problem Relation Age of Onset  . Heart attack Father   . Stroke Neg Hx    History  Substance Use Topics  . Smoking status: Never Smoker   . Smokeless tobacco: Not on file  . Alcohol Use: No   OB History    No data available     Review of Systems  Gastrointestinal: Positive for nausea and vomiting.  All other systems reviewed and are negative.      Allergies  Ciprofloxacin; Codeine; Depo-medrol;  Iodine; Levaquin; Percocet; Bactrim; and Keflex  Home Medications   Prior to Admission medications   Medication Sig Start Date End Date Taking? Authorizing Provider  albuterol (PROVENTIL HFA;VENTOLIN HFA) 108 (90 BASE) MCG/ACT inhaler Inhale 2 puffs into the lungs every 6 (six) hours as needed for wheezing.   Yes Historical Provider, MD  albuterol (PROVENTIL) (5 MG/ML) 0.5% nebulizer solution Take 2.5 mg by nebulization every 6 (six) hours as needed for wheezing.   Yes Historical Provider, MD  COD LIVER OIL PO Take 1 Dose by mouth daily.   Yes Historical Provider, MD  nitrofurantoin, macrocrystal-monohydrate, (MACROBID) 100 MG capsule Take 100 mg by mouth 2 (two) times daily. 7 day supply started evening of 6/30 08/20/14  Yes Historical Provider, MD   BP 126/64 mmHg  Pulse 105  Temp(Src) 99 F (37.2 C) (Oral)  Resp 16  Ht 5\' 6"  (1.676 m)  Wt 188 lb (85.276 kg)  BMI 30.36 kg/m2  SpO2 99%   Physical Exam  Constitutional: She is oriented to person, place, and time. She appears well-developed and well-nourished. No distress.  HENT:  Head: Normocephalic and atraumatic.  Mouth/Throat: Oropharynx is clear and moist.  Eyes: Conjunctivae and EOM are normal. Pupils are equal, round, and reactive to light.  Neck: Normal range of motion. Neck supple.  Cardiovascular: Normal rate, regular rhythm and normal heart sounds.   Pulmonary/Chest: Effort normal and breath sounds normal. No respiratory distress. She has no wheezes.  Abdominal: Soft.  Bowel sounds are normal. There is no tenderness. There is no guarding and no CVA tenderness.  Musculoskeletal: Normal range of motion.  Neurological: She is alert and oriented to person, place, and time.  Skin: Skin is warm and dry. She is not diaphoretic.  Psychiatric: She has a normal mood and affect.  Nursing note and vitals reviewed.   ED Course  Procedures (including critical care time) Labs Review Labs Reviewed  CBC WITH DIFFERENTIAL/PLATELET -  Abnormal; Notable for the following:    Neutrophils Relative % 93 (*)    Lymphocytes Relative 4 (*)    Lymphs Abs 0.4 (*)    All other components within normal limits  BASIC METABOLIC PANEL - Abnormal; Notable for the following:    Sodium 134 (*)    Chloride 100 (*)    CO2 20 (*)    Glucose, Bld 137 (*)    All other components within normal limits  URINALYSIS, ROUTINE W REFLEX MICROSCOPIC (NOT AT [facility]) - Abnormal; Notable for the following:    APPearance CLOUDY (*)    Hgb urine dipstick SMALL (*)    Ketones, ur >80 (*)    All other components within normal limits  URINE MICROSCOPIC-ADD ON - Abnormal; Notable for the following:    Squamous Epithelial / LPF FEW (*)    All other components within normal limits  URINE CULTURE    Imaging Review No results found.   EKG Interpretation None      MDM   Final diagnoses:  Nausea and vomiting, vomiting of unspecified type  Dysuria   59 year old female here with nausea and vomiting. She is currently being treatedfor UTI with Macrobid which she has never taken in the past. Nausea vomiting did not begin until starting this medication yesterday. Patient afebrile, nontoxic. Her abdominal exam is benign. She reports some dysuria, but denies flank pain.  Labwork was obtained which is reassuring-- bicarbonate minimally low which is likely due to vomiting. UA with ketones, but overall appears noninfectious. Will send for culture. Patient was given a liter of fluids and Zofran, she reports continued nausea but she has not had any further vomiting. She now informs me that last week she thinks she ate a "bad salad". She maintains that she did not have any nausea or vomiting until taking the Macrobid. She is also recently switched to a "low-carb" diet in an attempt to lose weight. Will give additional fluids and second round of Zofran and monitor symptoms.   2:39 PM After second liter of fluids and second dose of zofran, patient feeling much better.   She is easily tolerating PO fluids at this time.  Her vital signs remain stable on room air and she is in no acute distress. Abdominal exam remains benign.  N/V may be multi-factorial-- new abx, change in diet, and/or bad salad, irregardless symptoms are well controlled at this time.  Will d/c home with supportive care.  Rx pyridium for dysuria and zofran for N/V. Stop abx for now pending urine culture.  Patient to FU with PCP.  Encouraged fluids, gentle diet and progress back to normal as tolerated.  Discussed plan with patient, he/she acknowledged understanding and agreed with plan of care.  Return precautions given for new or worsening symptoms.  Case discussed with attending physician, Dr. Audie Pinto, agrees with assessment and plan of care.  Larene Pickett, PA-C 08/21/14 [place], MD 08/25/14 [phone]

## 2014-08-23 LAB — URINE CULTURE

## 2014-11-24 NOTE — Patient Instructions (Signed)
Felicia Bryant  11/24/2014     @PREFPERIOPPHARMACY @   Your procedure is scheduled on 11/30/2014.  Report to [facility] at 10:50 A.M.  Call this number if you have problems the morning of surgery:  [phone]   Remember:  Do not eat food or drink liquids after midnight.  Take these medicines the morning of surgery with A SIP OF WATER albuterol inhaler (bring with you to hospital), zofran   Do not wear jewelry, make-up or nail polish.  Do not wear lotions, powders, or perfumes.  You may wear deodorant.  Do not shave 48 hours prior to surgery.  Men may shave face and neck.  Do not bring valuables to the hospital.  [facility] is not responsible for any belongings or valuables.  Contacts, dentures or bridgework may not be worn into surgery.  Leave your suitcase in the car.  After surgery it may be brought to your room.  For patients admitted to the hospital, discharge time will be determined by your treatment team.  Patients discharged the day of surgery will not be allowed to drive home.    Please read over the following fact sheets that you were given. Anesthesia Post-op Instructions     PATIENT INSTRUCTIONS POST-ANESTHESIA  IMMEDIATELY FOLLOWING SURGERY:  Do not drive or operate machinery for the first twenty four hours after surgery.  Do not make any important decisions for twenty four hours after surgery or while taking narcotic pain medications or sedatives.  If you develop intractable nausea and vomiting or a severe headache please notify your doctor immediately.  FOLLOW-UP:  Please make an appointment with your surgeon as instructed. You do not need to follow up with anesthesia unless specifically instructed to do so.  WOUND CARE INSTRUCTIONS (if applicable):  Keep a dry clean dressing on the anesthesia/puncture wound site if there is drainage.  Once the wound has quit draining you may leave it open to air.  Generally you should leave the bandage intact for twenty four  hours unless there is drainage.  If the epidural site drains for more than 36-48 hours please call the anesthesia department.  QUESTIONS?:  Please feel free to call your physician or the hospital operator if you have any questions, and they will be happy to assist you.      Cataract Surgery  A cataract is a clouding of the lens of the eye. When a lens becomes cloudy, vision is reduced based on the degree and nature of the clouding. Surgery may be needed to improve vision. Surgery removes the cloudy lens and usually replaces it with a substitute lens (intraocular lens, IOL). LET YOUR EYE DOCTOR KNOW ABOUT:  Allergies to food or medicine.  Medicines taken including herbs, eye drops, over-the-counter medicines, and creams.  Use of steroids (by mouth or creams).  Previous problems with anesthetics or numbing medicine.  History of bleeding problems or blood clots.  Previous surgery.  Other health problems, including diabetes and kidney problems.  Possibility of pregnancy, if this applies. RISKS AND COMPLICATIONS  Infection.  Inflammation of the eyeball (endophthalmitis) that can spread to both eyes (sympathetic ophthalmia).  Poor wound healing.  If an IOL is inserted, it can later fall out of proper position. This is very uncommon.  Clouding of the part of your eye that holds an IOL in place. This is called an "after-cataract." These are uncommon but easily treated. BEFORE THE PROCEDURE  Do not eat or drink anything except small amounts of water  for 8 to 12 before your surgery, or as directed by your caregiver.  Unless you are told otherwise, continue any eye drops you have been prescribed.  Talk to your primary caregiver about all other medicines that you take (both prescription and nonprescription). In some cases, you may need to stop or change medicines near the time of your surgery. This is most important if you are taking blood-thinning medicine.Do not stop medicines unless  you are told to do so.  Arrange for someone to drive you to and from the procedure.  Do not put contact lenses in either eye on the day of your surgery. PROCEDURE There is more than one method for safely removing a cataract. Your doctor can explain the differences and help determine which is best for you. Phacoemulsification surgery is the most common form of cataract surgery.  An injection is given behind the eye or eye drops are given to make this a painless procedure.  A small cut (incision) is made on the edge of the clear, dome-shaped surface that covers the front of the eye (cornea).  A tiny probe is painlessly inserted into the eye. This device gives off ultrasound waves that soften and break up the cloudy center of the lens. This makes it easier for the cloudy lens to be removed by suction.  An IOL may be implanted.  The normal lens of the eye is covered by a clear capsule. Part of that capsule is intentionally left in the eye to support the IOL.  Your surgeon may or may not use stitches to close the incision. There are other forms of cataract surgery that require a larger incision and stitches to close the eye. This approach is taken in cases where the doctor feels that the cataract cannot be easily removed using phacoemulsification. AFTER THE PROCEDURE  When an IOL is implanted, it does not need care. It becomes a permanent part of your eye and cannot be seen or felt.  Your doctor will schedule follow-up exams to check on your progress.  Review your other medicines with your doctor to see which can be resumed after surgery.  Use eye drops or take medicine as prescribed by your doctor. Document Released: 01/26/2011 Document Revised: 06/23/2013 Document Reviewed: 01/26/2011 [facility] Patient Information 2015 [place], [place]. This information is not intended to replace advice given to you by your health care provider. Make sure you discuss any questions you have with your health  care provider.

## 2014-11-26 ENCOUNTER — Encounter (HOSPITAL_COMMUNITY): Payer: Self-pay

## 2014-11-26 ENCOUNTER — Encounter (HOSPITAL_COMMUNITY)
Admission: RE | Admit: 2014-11-26 | Discharge: 2014-11-26 | Disposition: A | Discharge status: Home / Self Care | Payer: 59 | Source: Ambulatory Visit | Attending: Ophthalmology | Admitting: Ophthalmology

## 2014-11-26 DIAGNOSIS — H2511 Age-related nuclear cataract, right eye: Secondary | ICD-10-CM | POA: Insufficient documentation

## 2014-11-26 DIAGNOSIS — Z01818 Encounter for other preprocedural examination: Secondary | ICD-10-CM | POA: Diagnosis present

## 2014-11-26 HISTORY — DX: Adverse effect of unspecified anesthetic, initial encounter: T41.45XA

## 2014-11-26 HISTORY — DX: Unspecified convulsions: R56.9

## 2014-11-26 HISTORY — DX: Other complications of anesthesia, initial encounter: T88.59XA

## 2014-11-26 LAB — CBC WITH DIFFERENTIAL/PLATELET
Basophils Absolute: 0 10*3/uL (ref 0.0–0.1)
Basophils Relative: 1 %
Eosinophils Absolute: 0.2 10*3/uL (ref 0.0–0.7)
Eosinophils Relative: 3 %
HEMATOCRIT: 39.9 % (ref 36.0–46.0)
HEMOGLOBIN: 13.4 g/dL (ref 12.0–15.0)
LYMPHS ABS: 3.3 10*3/uL (ref 0.7–4.0)
Lymphocytes Relative: 43 %
MCH: 29.5 pg (ref 26.0–34.0)
MCHC: 33.6 g/dL (ref 30.0–36.0)
MCV: 87.7 fL (ref 78.0–100.0)
Monocytes Absolute: 0.5 10*3/uL (ref 0.1–1.0)
Monocytes Relative: 6 %
NEUTROS ABS: 3.6 10*3/uL (ref 1.7–7.7)
NEUTROS PCT: 47 %
Platelets: 308 10*3/uL (ref 150–400)
RBC: 4.55 MIL/uL (ref 3.87–5.11)
RDW: 13.9 % (ref 11.5–15.5)
WBC: 7.6 10*3/uL (ref 4.0–10.5)

## 2014-11-26 LAB — BASIC METABOLIC PANEL
Anion gap: 10 (ref 5–15)
BUN: 18 mg/dL (ref 6–20)
CHLORIDE: 103 mmol/L (ref 101–111)
CO2: 24 mmol/L (ref 22–32)
CREATININE: 0.75 mg/dL (ref 0.44–1.00)
Calcium: 9.6 mg/dL (ref 8.9–10.3)
GFR calc Af Amer: >60 mL/min (ref >60)
GFR calc non Af Amer: >60 mL/min (ref >60)
Glucose, Bld: 110 mg/dL — ABNORMAL HIGH (ref 65–99)
Potassium: 4.2 mmol/L (ref 3.5–5.1)
Sodium: 137 mmol/L (ref 135–145)

## 2014-11-27 MED ORDER — LIDOCAINE HCL (PF) 1 % IJ SOLN
INTRAMUSCULAR | Status: AC
  Filled 2014-11-27: qty 2

## 2014-11-27 MED ORDER — CYCLOPENTOLATE-PHENYLEPHRINE OP SOLN OPTIME - NO CHARGE
OPHTHALMIC | Status: AC
  Filled 2014-11-27: qty 2

## 2014-11-27 MED ORDER — NEOMYCIN-POLYMYXIN-DEXAMETH 3.5-10000-0.1 OP SUSP
OPHTHALMIC | Status: AC
  Filled 2014-11-27: qty 5

## 2014-11-27 MED ORDER — LIDOCAINE HCL 3.5 % OP GEL
OPHTHALMIC | Status: AC
  Filled 2014-11-27: qty 1

## 2014-11-27 MED ORDER — TETRACAINE HCL 0.5 % OP SOLN
OPHTHALMIC | Status: AC
  Filled 2014-11-27: qty 2

## 2014-11-27 MED ORDER — PHENYLEPHRINE HCL 2.5 % OP SOLN
OPHTHALMIC | Status: AC
  Filled 2014-11-27: qty 15

## 2014-11-30 ENCOUNTER — Ambulatory Visit (HOSPITAL_COMMUNITY): Payer: 59 | Admitting: Anesthesiology

## 2014-11-30 ENCOUNTER — Encounter (HOSPITAL_COMMUNITY): Payer: Self-pay | Admitting: *Deleted

## 2014-11-30 ENCOUNTER — Ambulatory Visit (HOSPITAL_COMMUNITY)
Admission: RE | Admit: 2014-11-30 | Discharge: 2014-11-30 | Disposition: A | Discharge status: Home / Self Care | Payer: 59 | Source: Ambulatory Visit | Attending: Ophthalmology | Admitting: Ophthalmology

## 2014-11-30 ENCOUNTER — Encounter (HOSPITAL_COMMUNITY)
Admission: RE | Disposition: A | Discharge status: Home / Self Care | Payer: Self-pay | Source: Ambulatory Visit | Attending: Ophthalmology

## 2014-11-30 DIAGNOSIS — H25811 Combined forms of age-related cataract, right eye: Secondary | ICD-10-CM | POA: Insufficient documentation

## 2014-11-30 HISTORY — PX: CATARACT EXTRACTION W/PHACO: SHX586

## 2014-11-30 SURGERY — PHACOEMULSIFICATION, CATARACT, WITH IOL INSERTION
Anesthesia: Monitor Anesthesia Care | Site: Eye | Laterality: Right

## 2014-11-30 MED ORDER — MIDAZOLAM HCL 2 MG/2ML IJ SOLN
1.0000 mg | INTRAMUSCULAR | Status: DC | PRN
  Administered 2014-11-30: 2 mg via INTRAVENOUS

## 2014-11-30 MED ORDER — LIDOCAINE HCL 3.5 % OP GEL: 1.0000 "application " | Freq: Once | OPHTHALMIC | Status: DC

## 2014-11-30 MED ORDER — PROVISC 10 MG/ML IO SOLN
INTRAOCULAR | Status: DC | PRN
  Administered 2014-11-30: 0.85 mL via INTRAOCULAR

## 2014-11-30 MED ORDER — FENTANYL CITRATE (PF) 100 MCG/2ML IJ SOLN
25.0000 ug | INTRAMUSCULAR | Status: AC
  Administered 2014-11-30 (×2): 25 ug via INTRAVENOUS

## 2014-11-30 MED ORDER — FENTANYL CITRATE (PF) 100 MCG/2ML IJ SOLN
INTRAMUSCULAR | Status: AC
  Filled 2014-11-30: qty 2

## 2014-11-30 MED ORDER — MIDAZOLAM HCL 2 MG/2ML IJ SOLN
INTRAMUSCULAR | Status: AC
  Filled 2014-11-30: qty 2

## 2014-11-30 MED ORDER — BSS IO SOLN
INTRAOCULAR | Status: DC | PRN
  Administered 2014-11-30: 15 mL

## 2014-11-30 MED ORDER — LIDOCAINE 3.5 % OP GEL OPTIME - NO CHARGE
OPHTHALMIC | Status: DC | PRN
  Administered 2014-11-30: 1 [drp] via OPHTHALMIC

## 2014-11-30 MED ORDER — EPINEPHRINE HCL 1 MG/ML IJ SOLN
INTRAMUSCULAR | Status: AC
  Filled 2014-11-30: qty 1

## 2014-11-30 MED ORDER — CYCLOPENTOLATE-PHENYLEPHRINE 0.2-1 % OP SOLN
1.0000 [drp] | OPHTHALMIC | Status: AC
  Administered 2014-11-30 (×3): 1 [drp] via OPHTHALMIC

## 2014-11-30 MED ORDER — TETRACAINE HCL 0.5 % OP SOLN
1.0000 [drp] | OPHTHALMIC | Status: AC
  Administered 2014-11-30 (×3): 1 [drp] via OPHTHALMIC

## 2014-11-30 MED ORDER — EPINEPHRINE HCL 1 MG/ML IJ SOLN
INTRAOCULAR | Status: DC | PRN
  Administered 2014-11-30: 500 mL

## 2014-11-30 MED ORDER — PHENYLEPHRINE HCL 2.5 % OP SOLN
1.0000 [drp] | OPHTHALMIC | Status: AC
  Administered 2014-11-30 (×3): 1 [drp] via OPHTHALMIC

## 2014-11-30 MED ORDER — LACTATED RINGERS IV SOLN
INTRAVENOUS | Status: DC
  Administered 2014-11-30: 13:00:00 via INTRAVENOUS

## 2014-11-30 MED ORDER — LIDOCAINE HCL (PF) 1 % IJ SOLN
INTRAMUSCULAR | Status: DC | PRN
  Administered 2014-11-30: .6 mL

## 2014-11-30 MED ORDER — POVIDONE-IODINE 5 % OP SOLN
OPHTHALMIC | Status: DC | PRN
  Administered 2014-11-30: 1 via OPHTHALMIC

## 2014-11-30 SURGICAL SUPPLY — 11 items

## 2014-11-30 NOTE — Op Note (Signed)
Date of Admission: 11/30/2014  Date of Surgery: 11/30/2014   Pre-Op Dx: Cataract Right Eye  Post-Op Dx: Senile Combined Cataract Right  Eye,  Dx Code B34.193  Surgeon: Tonny Branch, M.D.  Assistants: None  Anesthesia: Topical with MAC  Indications: Painless, progressive loss of vision with compromise of daily activities.  Surgery: Cataract Extraction with Intraocular lens Implant Right Eye  Discription: The patient had dilating drops and viscous lidocaine placed into the Right eye in the pre-op holding area. After transfer to the operating room, a time out was performed. The patient was then prepped and draped. Beginning with a 19 degree blade a paracentesis port was made at the surgeon's 2 o'clock position. The anterior chamber was then filled with 1% non-preserved lidocaine. This was followed by filling the anterior chamber with Provisc.  A 2.48mm keratome blade was used to make a clear corneal incision at the temporal limbus.  A bent cystatome needle was used to create a continuous tear capsulotomy. Hydrodissection was performed with balanced salt solution on a Fine canula. The lens nucleus was then removed using the phacoemulsification handpiece. Residual cortex was removed with the I&A handpiece. The anterior chamber and capsular bag were refilled with Provisc. A posterior chamber intraocular lens was placed into the capsular bag with it's injector. The implant was positioned with the Kuglan hook. The Provisc was then removed from the anterior chamber and capsular bag with the I&A handpiece. Stromal hydration of the main incision and paracentesis port was performed with BSS on a Fine canula. The wounds were tested for leak which was negative. The patient tolerated the procedure well. There were no operative complications. The patient was then transferred to the recovery room in stable condition.  Complications: None  Specimen: None  EBL: None  Prosthetic device: Hoya iSert 250, power 22.5  D, SN [id number].

## 2014-11-30 NOTE — Addendum Note (Signed)
Addendum  created 11/30/14 1353 by Ollen Bowl, CRNA   Modules edited: Charges VN

## 2014-11-30 NOTE — Anesthesia Postprocedure Evaluation (Signed)
  Anesthesia Post-op Note  Patient: Felicia Bryant  Procedure(s) Performed: Procedure(s) with comments: CATARACT EXTRACTION PHACO AND INTRAOCULAR LENS PLACEMENT (IOC) (Right) - CDE:10.59  Patient Location: Short Stay  Anesthesia Type:MAC  Level of Consciousness: awake, alert  and oriented  Airway and Oxygen Therapy: Patient Spontanous Breathing  Post-op Pain: none  Post-op Assessment: Post-op Vital signs reviewed, Patient's Cardiovascular Status Stable, Respiratory Function Stable, Patent Airway and No signs of Nausea or vomiting              Post-op Vital Signs: Reviewed and stable  Last Vitals:  Filed Vitals:   11/30/14 1206  BP: 131/68  Pulse: 98  Temp: 36.6 C  Resp: 18    Complications: No apparent anesthesia complications

## 2014-11-30 NOTE — Anesthesia Preprocedure Evaluation (Signed)
Anesthesia Evaluation  Patient identified by MRN, date of birth, ID band Patient awake    Reviewed: Allergy & Precautions, NPO status , Patient's Chart, lab work & pertinent test results  History of Anesthesia Complications (+) history of anesthetic complications (possible laryngeal injury post intubation ?)  Airway Mallampati: II  TM Distance: >3 FB     Dental  (+) Teeth Intact   Pulmonary asthma ,    breath sounds clear to auscultation       Cardiovascular negative cardio ROS   Rhythm:Regular Rate:Normal     Neuro/Psych Seizures -, Well Controlled,     GI/Hepatic negative GI ROS,   Endo/Other    Renal/GU      Musculoskeletal   Abdominal   Peds  Hematology   Anesthesia Other Findings   Reproductive/Obstetrics                             Anesthesia Physical Anesthesia Plan  ASA: II  Anesthesia Plan: MAC   Post-op Pain Management:    Induction: Intravenous  Airway Management Planned: Nasal Cannula  Additional Equipment:   Intra-op Plan:   Post-operative Plan:   Informed Consent: I have reviewed the patients History and Physical, chart, labs and discussed the procedure including the risks, benefits and alternatives for the proposed anesthesia with the patient or authorized representative who has indicated his/her understanding and acceptance.     Plan Discussed with:   Anesthesia Plan Comments:         Anesthesia Quick Evaluation

## 2014-11-30 NOTE — Discharge Instructions (Signed)

## 2014-11-30 NOTE — Transfer of Care (Signed)
Immediate Anesthesia Transfer of Care Note  Patient: Felicia Bryant  Procedure(s) Performed: Procedure(s) with comments: CATARACT EXTRACTION PHACO AND INTRAOCULAR LENS PLACEMENT (IOC) (Right) - CDE:10.59  Patient Location: Short Stay  Anesthesia Type:MAC  Level of Consciousness: awake  Airway & Oxygen Therapy: Patient Spontanous Breathing  Post-op Assessment: Report given to RN  Post vital signs: Reviewed  Last Vitals:  Filed Vitals:   11/30/14 1206  BP: 131/68  Pulse: 98  Temp: 36.6 C  Resp: 18    Complications: No apparent anesthesia complications

## 2014-11-30 NOTE — H&P (Signed)
I have reviewed the H&P, the patient was re-examined, and I have identified no interval changes in medical condition and plan of care since the history and physical of record  

## 2014-12-01 ENCOUNTER — Encounter (HOSPITAL_COMMUNITY): Payer: Self-pay | Admitting: Ophthalmology

## 2016-03-06 DIAGNOSIS — Z961 Presence of intraocular lens: Secondary | ICD-10-CM | POA: Diagnosis not present

## 2016-03-06 DIAGNOSIS — H25092 Other age-related incipient cataract, left eye: Secondary | ICD-10-CM | POA: Diagnosis not present

## 2016-03-06 DIAGNOSIS — H5203 Hypermetropia, bilateral: Secondary | ICD-10-CM | POA: Diagnosis not present

## 2016-04-20 DIAGNOSIS — L918 Other hypertrophic disorders of the skin: Secondary | ICD-10-CM | POA: Diagnosis not present

## 2016-04-20 DIAGNOSIS — L820 Inflamed seborrheic keratosis: Secondary | ICD-10-CM | POA: Diagnosis not present

## 2016-07-25 DIAGNOSIS — Z01419 Encounter for gynecological examination (general) (routine) without abnormal findings: Secondary | ICD-10-CM | POA: Diagnosis not present

## 2016-07-25 DIAGNOSIS — Z6824 Body mass index (BMI) 24.0-24.9, adult: Secondary | ICD-10-CM | POA: Diagnosis not present

## 2016-11-17 DIAGNOSIS — H43813 Vitreous degeneration, bilateral: Secondary | ICD-10-CM | POA: Diagnosis not present

## 2017-05-21 DIAGNOSIS — E663 Overweight: Secondary | ICD-10-CM | POA: Diagnosis not present

## 2017-05-21 DIAGNOSIS — Z1389 Encounter for screening for other disorder: Secondary | ICD-10-CM | POA: Diagnosis not present

## 2017-05-21 DIAGNOSIS — E559 Vitamin D deficiency, unspecified: Secondary | ICD-10-CM | POA: Diagnosis not present

## 2017-05-21 DIAGNOSIS — Z0000 Encounter for general adult medical examination without abnormal findings: Secondary | ICD-10-CM | POA: Diagnosis not present

## 2017-05-21 DIAGNOSIS — R002 Palpitations: Secondary | ICD-10-CM | POA: Diagnosis not present

## 2017-05-24 ENCOUNTER — Ambulatory Visit: Payer: 59 | Admitting: Cardiology

## 2017-05-24 ENCOUNTER — Encounter: Payer: Self-pay | Admitting: Cardiology

## 2017-05-24 VITALS — BP 112/72 | HR 71 | Ht 66.0 in | Wt 153.0 lb

## 2017-05-24 DIAGNOSIS — R002 Palpitations: Secondary | ICD-10-CM

## 2017-05-24 NOTE — Progress Notes (Signed)
Cardiology Office Note:    Date:  05/24/2017   ID:  Felicia Bryant, DOB Mar 03, 1955, MRN [id number]  PCP:  Hiram Comber, PA-C  Cardiologist:  No primary care provider on file.   Referring MD: Sinda Du, MD     History of Present Illness:    Felicia Bryant is a 62 y.o. female  here for the evaluation of palpitations at the request of Dr. Luan Pulling.  My last office visit was on 06/24/14 where she was describing her heart racing 10 days prior to an urgent care visit.  She was in a stressful situation.  She had tried a new herbal medication at the time.  EKG was unremarkable at urgent care.  Chest x-ray was fine, hemoglobin 14 creatinine normal.    Has had 70 pound weight loss, plant based. HR improved. BP improved. No longer prediabetic. Husband got rid. [place] diet. "What the health" documentary. Now running through [facility]  [phone]. Nutrition facts .org. Dr. Roland Earl. The daily dozen  Movie/ TV show and taxes and palps. [place]. Adrenalin. Felt increasing. Violent crazy. Skipping, went out of rhythm she said. Prayer group. Woke up felt better.  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   Her symptoms have subsided.  No strokelike symptoms, no fevers chills nausea vomiting syncope bleeding.  Past Medical History:  Diagnosis Date  . Asthma   . Complication of anesthesia    pt states i have had trouble with my throat closing after i had a tube down my throat before."  . Palpitations    seen Dr.Kelbi Renstrom in 2009..holter with pac's  . Palpitations    echocardiogram in 3976 with diastolic dysfunction EF 57 %  . Seizures ([place])    had seizure as child from vaccines and has had not seizures since was a child'.    Past Surgical History:  Procedure Laterality Date  . CATARACT EXTRACTION W/PHACO Right 11/30/2014   Procedure: CATARACT EXTRACTION PHACO AND INTRAOCULAR LENS PLACEMENT ([place]);  Surgeon: Tonny Branch, MD;  Location: [facility];  Service: Ophthalmology;  Laterality: Right;  CDE:10.59  .  CHOLECYSTECTOMY    . conization of cervix    . DILATION AND CURETTAGE OF UTERUS     episiotomy repair also.  . TONSILLECTOMY      Current Medications: Current Meds  Medication Sig  . Cholecalciferol (VITAMIN D PO) Take 1 tablet by mouth daily.  . Cyanocobalamin (VITAMIN B-12) 3000 MCG SUBL Place 1 tablet under the tongue daily.  . Vitamin Mixture (ESTER-C PO) Take 1 tablet by mouth daily.     Allergies:   Benadryl [diphenhydramine hcl (sleep)]; Ciprofloxacin; Codeine; Depo-medrol [methylprednisolone acetate]; Fluzone [flu virus vaccine]; Iodine; Levaquin [levofloxacin]; Percocet [oxycodone-acetaminophen]; Bactrim [sulfamethoxazole-trimethoprim]; and Keflex [cephalexin]   Social History   Socioeconomic History  . Marital status: Married    Spouse name: Not on file  . Number of children: Not on file  . Years of education: Not on file  . Highest education level: Not on file  Occupational History  . Not on file  Social Needs  . Financial resource strain: Not on file  . Food insecurity:    Worry: Not on file    Inability: Not on file  . Transportation needs:    Medical: Not on file    Non-medical: Not on file  Tobacco Use  . Smoking status: Never Smoker  . Smokeless tobacco: Never Used  Substance and Sexual Activity  . Alcohol use: No  . Drug use: No  . Sexual  activity: Yes    Birth control/protection: None, Post-menopausal  Lifestyle  . Physical activity:    Days per week: Not on file    Minutes per session: Not on file  . Stress: Not on file  Relationships  . Social connections:    Talks on phone: Not on file    Gets together: Not on file    Attends religious service: Not on file    Active member of club or organization: Not on file    Attends meetings of clubs or organizations: Not on file    Relationship status: Not on file  Other Topics Concern  . Not on file  Social History Narrative  . Not on file     Family History: The patient's family history  includes Heart attack in her father. There is no history of Stroke.  ROS:   Please see the history of present illness.     All other systems reviewed and are negative.  EKGs/Labs/Other Studies Reviewed:    The following studies were reviewed today: Echocardiogram 07/02/14-normal EF no other abnormalities.  EKG:  EKG is ordered today.  The ekg ordered today demonstrates 4/419 - NSR no changes.  Prior EKG from 06/24/14 personally reviewed showing normal sinus rhythm no other significant abnormalities.  Recent Labs: No results found for requested labs within last 8760 hours.  Recent Lipid Panel No results found for: CHOL, TRIG, HDL, CHOLHDL, VLDL, LDLCALC, LDLDIRECT  Physical Exam:    VS:  BP 112/72   Pulse 71   Ht 5\' 6"  (1.676 m)   Wt 153 lb (69.4 kg)   BMI 24.69 kg/m     Wt Readings from Last 3 Encounters:  05/24/17 153 lb (69.4 kg)  11/30/14 177 lb (80.3 kg)  11/26/14 177 lb 6.4 oz (80.5 kg)     GEN:  Well nourished, well developed in no acute distress HEENT: Normal NECK: No JVD; No carotid bruits LYMPHATICS: No lymphadenopathy CARDIAC: RRR, no murmurs, rubs, gallops RESPIRATORY:  Clear to auscultation without rales, wheezing or rhonchi  ABDOMEN: Soft, non-tender, non-distended MUSCULOSKELETAL:  No edema; No deformity  SKIN: Warm and dry NEUROLOGIC:  Alert and oriented x 3 PSYCHIATRIC:  Normal affect   ASSESSMENT:    1. Palpitations    PLAN:    In order of problems listed above:  Palpitations -3 years ago perhaps the herbal supplementation may have been contributing.  No high risk symptoms such as syncope or chest pain.  The seem to be brought on by an adrenaline rush with a variety of stressful factors.  We will go ahead and check an echocardiogram to ensure proper structure and function of her heart.  She did remind me that when she wore a monitor the last time the stickers burned her skin.  I mentioned to them the apple watch or cardia device.  Her husband was  familiar with the cardia device.  If symptoms worsen or become more worrisome, she knows to contact us for further evaluation.  Try to avoid excessive stressors.  No medications at this time.  Occasional palpitations described.  If atrial fibrillation present, her risk score is 1 for female.   We will let her know the results of the echo than see her back on as-needed basis.  Continue to the great work with plant-based diet.   Medication Adjustments/Labs and Tests Ordered: Current medicines are reviewed at length with the patient today.  Concerns regarding medicines are outlined above.  Orders Placed This Encounter  Procedures  .  EKG 12-Lead  . ECHOCARDIOGRAM COMPLETE   No orders of the defined types were placed in this encounter.   Signed, Candee Furbish, MD  05/24/2017 10:01 AM    [facility]

## 2017-05-24 NOTE — Patient Instructions (Signed)
Medication Instructions:  The current medical regimen is effective;  continue present plan and medications.  Testing/Procedures: Your physician has requested that you have an echocardiogram. Echocardiography is a painless test that uses sound waves to create images of your heart. It provides your doctor with information about the size and shape of your heart and how well your heart's chambers and valves are working. This procedure takes approximately one hour. There are no restrictions for this procedure.  Follow-Up: Follow up as needed after the above testing.  If you need a refill on your cardiac medications before your next appointment, please call your pharmacy.  Thank you for choosing [facility]!!     

## 2017-05-25 ENCOUNTER — Ambulatory Visit (HOSPITAL_COMMUNITY): Payer: 59 | Attending: Cardiovascular Disease

## 2017-05-25 ENCOUNTER — Other Ambulatory Visit: Payer: Self-pay

## 2017-05-25 DIAGNOSIS — J45909 Unspecified asthma, uncomplicated: Secondary | ICD-10-CM | POA: Diagnosis not present

## 2017-05-25 DIAGNOSIS — I071 Rheumatic tricuspid insufficiency: Secondary | ICD-10-CM | POA: Diagnosis not present

## 2017-05-25 DIAGNOSIS — R002 Palpitations: Secondary | ICD-10-CM | POA: Diagnosis not present

## 2017-05-29 ENCOUNTER — Telehealth: Payer: Self-pay

## 2017-05-29 NOTE — Telephone Encounter (Signed)
Notes sent to scheduling.   

## 2017-08-17 DIAGNOSIS — N760 Acute vaginitis: Secondary | ICD-10-CM | POA: Diagnosis not present

## 2017-09-10 DIAGNOSIS — N952 Postmenopausal atrophic vaginitis: Secondary | ICD-10-CM | POA: Diagnosis not present

## 2017-09-10 DIAGNOSIS — Z01419 Encounter for gynecological examination (general) (routine) without abnormal findings: Secondary | ICD-10-CM | POA: Diagnosis not present

## 2017-09-26 DIAGNOSIS — M6250 Muscle wasting and atrophy, not elsewhere classified, unspecified site: Secondary | ICD-10-CM | POA: Diagnosis not present

## 2017-09-26 DIAGNOSIS — M62838 Other muscle spasm: Secondary | ICD-10-CM | POA: Diagnosis not present

## 2017-10-10 DIAGNOSIS — M62838 Other muscle spasm: Secondary | ICD-10-CM | POA: Diagnosis not present

## 2017-10-10 DIAGNOSIS — M6250 Muscle wasting and atrophy, not elsewhere classified, unspecified site: Secondary | ICD-10-CM | POA: Diagnosis not present

## 2017-12-07 DIAGNOSIS — N950 Postmenopausal bleeding: Secondary | ICD-10-CM | POA: Diagnosis not present

## 2017-12-11 DIAGNOSIS — N950 Postmenopausal bleeding: Secondary | ICD-10-CM | POA: Diagnosis not present

## 2017-12-20 DIAGNOSIS — N950 Postmenopausal bleeding: Secondary | ICD-10-CM | POA: Diagnosis not present

## 2017-12-20 DIAGNOSIS — N840 Polyp of corpus uteri: Secondary | ICD-10-CM | POA: Diagnosis not present

## 2017-12-20 DIAGNOSIS — R9389 Abnormal findings on diagnostic imaging of other specified body structures: Secondary | ICD-10-CM | POA: Diagnosis not present

## 2018-01-09 DIAGNOSIS — H2512 Age-related nuclear cataract, left eye: Secondary | ICD-10-CM | POA: Diagnosis not present

## 2018-01-09 DIAGNOSIS — H43813 Vitreous degeneration, bilateral: Secondary | ICD-10-CM | POA: Diagnosis not present

## 2018-01-09 DIAGNOSIS — H25042 Posterior subcapsular polar age-related cataract, left eye: Secondary | ICD-10-CM | POA: Diagnosis not present

## 2018-01-25 DIAGNOSIS — C541 Malignant neoplasm of endometrium: Secondary | ICD-10-CM | POA: Diagnosis not present

## 2018-01-25 DIAGNOSIS — N950 Postmenopausal bleeding: Secondary | ICD-10-CM | POA: Diagnosis not present

## 2018-01-25 DIAGNOSIS — N840 Polyp of corpus uteri: Secondary | ICD-10-CM | POA: Diagnosis not present

## 2018-02-05 ENCOUNTER — Telehealth: Payer: Self-pay | Admitting: *Deleted

## 2018-02-05 NOTE — Telephone Encounter (Signed)
Called and spoke with the patient regarding a new patient appt. Patient was offered an appt on December 30th with Dr. Gerarda Fraction, patient declined the appt. Patient stated "Dr. Corinna Capra really wanted me to see Dr. Denman George. I have done a lot of research on Dr. Denman George and liked everything I saw. I would like to see her." Explained to the patient that Dr. Denman George first available appt would be January 3rd. Patient stated "I really want to see her, can you get me in any earlier with her at all." Explained to the patient "Dr. Denman George is in the [place], and only has one day left before the end of the year to see patients. That day is full. Sometimes we open clinic at the last minute. I can call her if this happens." Patient agreed to the above.   Called and left Santiago Glad a message that the patient is wanting to wait until January to see Dr. Denman George.

## 2018-02-06 DIAGNOSIS — Z961 Presence of intraocular lens: Secondary | ICD-10-CM | POA: Diagnosis not present

## 2018-02-06 DIAGNOSIS — H2512 Age-related nuclear cataract, left eye: Secondary | ICD-10-CM | POA: Diagnosis not present

## 2018-02-06 DIAGNOSIS — H5212 Myopia, left eye: Secondary | ICD-10-CM | POA: Diagnosis not present

## 2018-02-10 ENCOUNTER — Emergency Department (HOSPITAL_COMMUNITY)
Admission: EM | Admit: 2018-02-10 | Discharge: 2018-02-11 | Disposition: A | Discharge status: Home / Self Care | Payer: 59 | Attending: Emergency Medicine | Admitting: Emergency Medicine

## 2018-02-10 DIAGNOSIS — R1031 Right lower quadrant pain: Secondary | ICD-10-CM | POA: Insufficient documentation

## 2018-02-10 DIAGNOSIS — Z79899 Other long term (current) drug therapy: Secondary | ICD-10-CM | POA: Insufficient documentation

## 2018-02-10 DIAGNOSIS — C55 Malignant neoplasm of uterus, part unspecified: Secondary | ICD-10-CM | POA: Diagnosis not present

## 2018-02-10 DIAGNOSIS — K5730 Diverticulosis of large intestine without perforation or abscess without bleeding: Secondary | ICD-10-CM | POA: Diagnosis not present

## 2018-02-11 ENCOUNTER — Encounter (HOSPITAL_COMMUNITY): Payer: Self-pay | Admitting: Emergency Medicine

## 2018-02-11 ENCOUNTER — Other Ambulatory Visit: Payer: Self-pay

## 2018-02-11 ENCOUNTER — Emergency Department (HOSPITAL_COMMUNITY): Payer: 59

## 2018-02-11 DIAGNOSIS — K5730 Diverticulosis of large intestine without perforation or abscess without bleeding: Secondary | ICD-10-CM | POA: Diagnosis not present

## 2018-02-11 LAB — COMPREHENSIVE METABOLIC PANEL
ALT: 11 U/L (ref 0–44)
ANION GAP: 10 (ref 5–15)
AST: 21 U/L (ref 15–41)
Albumin: 4.2 g/dL (ref 3.5–5.0)
Alkaline Phosphatase: 79 U/L (ref 38–126)
BILIRUBIN TOTAL: 0.7 mg/dL (ref 0.3–1.2)
BUN: 13 mg/dL (ref 8–23)
CO2: 25 mmol/L (ref 22–32)
Calcium: 9.6 mg/dL (ref 8.9–10.3)
Chloride: 103 mmol/L (ref 98–111)
Creatinine, Ser: 0.7 mg/dL (ref 0.44–1.00)
GFR calc Af Amer: >60 mL/min (ref >60)
Glucose, Bld: 105 mg/dL — ABNORMAL HIGH (ref 70–99)
POTASSIUM: 4.1 mmol/L (ref 3.5–5.1)
Sodium: 138 mmol/L (ref 135–145)
TOTAL PROTEIN: 7.3 g/dL (ref 6.5–8.1)

## 2018-02-11 LAB — CBC WITH DIFFERENTIAL/PLATELET
Abs Immature Granulocytes: 0.01 10*3/uL (ref 0.00–0.07)
BASOS ABS: 0 10*3/uL (ref 0.0–0.1)
BASOS PCT: 1 %
EOS ABS: 0.1 10*3/uL (ref 0.0–0.5)
Eosinophils Relative: 1 %
HCT: 39.6 % (ref 36.0–46.0)
Hemoglobin: 12.9 g/dL (ref 12.0–15.0)
IMMATURE GRANULOCYTES: 0 %
Lymphocytes Relative: 36 %
Lymphs Abs: 2.8 10*3/uL (ref 0.7–4.0)
MCH: 30.1 pg (ref 26.0–34.0)
MCHC: 32.6 g/dL (ref 30.0–36.0)
MCV: 92.3 fL (ref 80.0–100.0)
MONOS PCT: 8 %
Monocytes Absolute: 0.6 10*3/uL (ref 0.1–1.0)
NEUTROS PCT: 54 %
NRBC: 0 % (ref 0.0–0.2)
Neutro Abs: 4.1 10*3/uL (ref 1.7–7.7)
PLATELETS: 262 10*3/uL (ref 150–400)
RBC: 4.29 MIL/uL (ref 3.87–5.11)
RDW: 11.8 % (ref 11.5–15.5)
WBC: 7.7 10*3/uL (ref 4.0–10.5)

## 2018-02-11 LAB — URINALYSIS, ROUTINE W REFLEX MICROSCOPIC
Bilirubin Urine: NEGATIVE
GLUCOSE, UA: NEGATIVE mg/dL
KETONES UR: 15 mg/dL — AB
Nitrite: NEGATIVE
PROTEIN: NEGATIVE mg/dL
Specific Gravity, Urine: <1.005 — ABNORMAL LOW (ref 1.005–1.030)
pH: 6.5 (ref 5.0–8.0)

## 2018-02-11 LAB — URINALYSIS, MICROSCOPIC (REFLEX)

## 2018-02-11 LAB — LIPASE, BLOOD: LIPASE: 31 U/L (ref 11–51)

## 2018-02-11 MED ORDER — BARIUM SULFATE 2.1 % PO SUSP
ORAL | Status: AC
  Filled 2018-02-11: qty 2

## 2018-02-11 NOTE — ED Triage Notes (Signed)
The patient said she started having right sided abdominal pain since Friday.  She said she just had a D & C two weeks ago and says she thinks that might have aggravated something inside.  She had the procedure due to a growth in her uterus.  She denies any other symptoms.  She is here due to the pain getting worse.

## 2018-02-11 NOTE — ED Provider Notes (Signed)
[facility] EMERGENCY DEPARTMENT Provider Note   CSN: [id number] Arrival date & time: 02/10/18  2345     History   Chief Complaint Chief Complaint  Patient presents with  . Abdominal Pain    HPI Felicia Bryant is a 62 y.o. female.  The history is provided by the patient.  Abdominal Pain    She has history of asthma and comes in complaining of right lower quadrant pain for the last 2 days.  Pain came on while she was exercising and she thought she pulled a muscle.  It was severe yesterday, seemed to get better this morning, but then got worse this evening.  There is some radiation to the back.  Pain has been as severe as 8/10, but is now 5/10.  There is no associated nausea or vomiting but there has been anorexia.  She denies constipation or diarrhea.  She denies any urinary difficulty.  She denies fever, chills, sweats.  Pain is worse when she stands up, but nothing seems to make it better.  She has not taken anything for it other than applying a heating pad.  She also relates concerned that she has recently been diagnosed with class I uterine cancer, and is scheduled to see her oncologist next week.  Past Medical History:  Diagnosis Date  . Asthma   . Complication of anesthesia    pt states i have had trouble with my throat closing after i had a tube down my throat before."  . Palpitations    seen Dr.Skains in 2009..holter with pac's  . Palpitations    echocardiogram in 6644 with diastolic dysfunction EF 57 %  . Seizures ([place])    had seizure as child from vaccines and has had not seizures since was a child'.    Patient Active Problem List   Diagnosis Date Noted  . Palpitations 05/24/2017    Past Surgical History:  Procedure Laterality Date  . CATARACT EXTRACTION W/PHACO Right 11/30/2014   Procedure: CATARACT EXTRACTION PHACO AND INTRAOCULAR LENS PLACEMENT ([place]);  Surgeon: Tonny Branch, MD;  Location: [facility];  Service: Ophthalmology;  Laterality: Right;   CDE:10.59  . CHOLECYSTECTOMY    . conization of cervix    . DILATION AND CURETTAGE OF UTERUS     episiotomy repair also.  . TONSILLECTOMY       OB History   No obstetric history on file.      Home Medications    Prior to Admission medications   Medication Sig Start Date End Date Taking? Authorizing Provider  Cholecalciferol (VITAMIN D PO) Take 1 tablet by mouth daily.    [provider]  Cyanocobalamin (VITAMIN B-12) 3000 MCG SUBL Place 1 tablet under the tongue daily.    [provider]  Vitamin Mixture (ESTER-C PO) Take 1 tablet by mouth daily.    [provider]    Family History Family History  Problem Relation Age of Onset  . Heart attack Father   . Stroke Neg Hx     Social History Social History   Tobacco Use  . Smoking status: Never Smoker  . Smokeless tobacco: Never Used  Substance Use Topics  . Alcohol use: No  . Drug use: No     Allergies   Benadryl [diphenhydramine hcl (sleep)]; Ciprofloxacin; Codeine; Depo-medrol [methylprednisolone acetate]; Fluzone [flu virus vaccine]; Iodine; Levaquin [levofloxacin]; Percocet [oxycodone-acetaminophen]; Bactrim [sulfamethoxazole-trimethoprim]; and Keflex [cephalexin]   Review of Systems Review of Systems  Gastrointestinal: Positive for abdominal pain.  All other  systems reviewed and are negative.    Physical Exam Updated Vital Signs BP (!) 116/56 (BP Location: Left Arm)   Pulse 71   Temp (!) 97.4 F (36.3 C) (Oral)   Resp 14   SpO2 100%   Physical Exam Vitals signs and nursing note reviewed.    62 year old female, resting comfortably and in no acute distress. Vital signs are normal. Oxygen saturation is 100%, which is normal. Head is normocephalic and atraumatic. PERRLA, EOMI. Oropharynx is clear. Neck is nontender and supple without adenopathy or JVD. Back is nontender and there is no CVA tenderness. Lungs are clear without rales, wheezes, or rhonchi. Chest is  nontender. Heart has regular rate and rhythm without murmur. Abdomen is soft, flat, with vague right lower quadrant tenderness.  The point that she identifies as tender seems to move.  There is no rebound or guarding.  There are no masses or hepatosplenomegaly and peristalsis is hypoactive. Extremities have no cyanosis or edema, full range of motion is present. Skin is warm and dry without rash. Neurologic: Mental status is normal, cranial nerves are intact, there are no motor or sensory deficits.  ED Treatments / Results  Labs (all labs ordered are listed, but only abnormal results are displayed) Labs Reviewed  COMPREHENSIVE METABOLIC PANEL - Abnormal; Notable for the following components:      Result Value   Glucose, Bld 105 (*)    All other components within normal limits  URINALYSIS, ROUTINE W REFLEX MICROSCOPIC - Abnormal; Notable for the following components:   Specific Gravity, Urine <1.005 (*)    Hgb urine dipstick TRACE (*)    Ketones, ur 15 (*)    Leukocytes, UA SMALL (*)    All other components within normal limits  URINALYSIS, MICROSCOPIC (REFLEX) - Abnormal; Notable for the following components:   Bacteria, UA RARE (*)    All other components within normal limits  LIPASE, BLOOD  CBC WITH DIFFERENTIAL/PLATELET   Radiology Ct Abdomen Pelvis Wo Contrast  Result Date: 02/11/2018 CLINICAL DATA:  RIGHT abdominal pain for 3 days. Uterine dilatation and curettage 2 weeks ago. EXAM: CT ABDOMEN AND PELVIS WITHOUT CONTRAST TECHNIQUE: Multidetector CT imaging of the abdomen and pelvis was performed following the standard protocol without IV contrast. COMPARISON:  None. FINDINGS: LOWER CHEST: Lung bases are clear. The visualized heart size is normal. No pericardial effusion. HEPATOBILIARY: Status post cholecystectomy. Normal non-contrast CT liver. PANCREAS: Normal. SPLEEN: Normal. ADRENALS/URINARY TRACT: Kidneys are orthotopic, demonstrating normal size and morphology. No  nephrolithiasis, hydronephrosis; limited assessment for renal masses by nonenhanced CT. The unopacified ureters are normal in course and caliber. Urinary bladder is partially distended and unremarkable. Normal adrenal glands. STOMACH/BOWEL: The stomach, small and large bowel are normal in course and caliber without inflammatory changes, sensitivity decreased by lack of enteric contrast. Moderate volume retained large bowel stool. Moderate descending and sigmoid with a few additional colonic diverticulosis scattered diverticula. Normal appendix. VASCULAR/LYMPHATIC: Aortoiliac vessels are normal in course and caliber. No lymphadenopathy by CT size criteria. REPRODUCTIVE: Normal. OTHER: No intraperitoneal free fluid or free air. MUSCULOSKELETAL: Non-acute. Moderate RIGHT hip osteoarthrosis. Advanced lower lumbar facet arthropathy. IMPRESSION: 1. Colonic diverticulosis without acute diverticulitis nor acute intra-abdominal/pelvic process. 2. Moderate volume retained large bowel stool. Electronically Signed   By: Elon Alas M.D.   On: 02/11/2018 03:06    Procedures Procedures   Medications Ordered in ED Medications  Barium Sulfate 2.1 % SUSP (has no administration in time range)     Initial  Impression / Assessment and Plan / ED Course  I have reviewed the triage vital signs and the nursing notes.  Pertinent labs & imaging results that were available during my care of the patient were reviewed by me and considered in my medical decision making (see chart for details).  Right lower quadrant pain of uncertain cause.  Consider appendicitis, diverticulitis, urinary tract infection, urolithiasis.  Exam is benign.  Will check urinalysis, screening labs and sent for CT of abdomen and pelvis.  Patient is declining any medication at this point.  Old records are reviewed, and she has no relevant past visits.  Labs are reassuring.  CT abdomen shows evidence of diverticulosis but no diverticulitis and no  acute process.  This was explained to patient.  She feels much better, but is still concerned about her cancer.  She does have follow-up with her GYN oncologist scheduled.  Recommended that she use ice and/or heat for her pain, use over-the-counter analgesics as needed.  Return precautions discussed.  Final Clinical Impressions(s) / ED Diagnoses   Final diagnoses:  RLQ abdominal pain    ED Discharge Orders    None       Delora Fuel, MD 81/85/63 [phone]

## 2018-02-11 NOTE — Discharge Instructions (Addendum)
Apply ice, heat as needed.  Take acetaminophen, ibuprofen, or naproxen as needed for pain.  Return if symptoms are getting worse.

## 2018-02-22 ENCOUNTER — Inpatient Hospital Stay: Payer: 59 | Attending: Gynecologic Oncology | Admitting: Gynecologic Oncology

## 2018-02-22 ENCOUNTER — Encounter: Payer: Self-pay | Admitting: Gynecologic Oncology

## 2018-02-22 VITALS — BP 111/59 | HR 82 | Temp 98.3°F | Resp 18 | Ht 65.5 in | Wt 153.0 lb

## 2018-02-22 DIAGNOSIS — C541 Malignant neoplasm of endometrium: Secondary | ICD-10-CM | POA: Insufficient documentation

## 2018-02-22 NOTE — Patient Instructions (Signed)
We will contact you with the results of your endometrial biopsy from today.  The options discussed include progesterone therapy with an IUD (intrauterine device), radiation therapy, or surgery including a total hysterectomy, bilateral salpingo-oophorectomy, and lymph node sampling.  Plan to follow up within one month to discuss.   Endometrial Biopsy, Care After  This sheet gives you information about how to care for yourself after your procedure. Your health care provider may also give you more specific instructions. If you have problems or questions, contact your health care provider. What can I expect after the procedure? After the procedure, it is common to have:  Mild cramping.  A small amount of vaginal bleeding for a few days. This is normal. Follow these instructions at home:   Take over-the-counter and prescription medicines only as told by your health care provider.  Do not douche, use tampons, or have sexual intercourse until your health care provider approves.  Return to your normal activities as told by your health care provider. Ask your health care provider what activities are safe for you.  Follow instructions from your health care provider about any activity restrictions, such as restrictions on strenuous exercise or heavy lifting. Contact a health care provider if:  You have heavy bleeding, or bleed for longer than 2 days after the procedure.  You have bad smelling discharge from your vagina.  You have a fever or chills.  You have a burning sensation when urinating or you have difficulty urinating.  You have severe pain in your lower abdomen. Get help right away if:  You have severe cramps in your stomach or back.  You pass large blood clots.  Your bleeding increases.  You become weak or light-headed, or you pass out. Summary  After the procedure, it is common to have mild cramping and a small amount of vaginal bleeding for a few days.  Do not douche,  use tampons, or have sexual intercourse until your health care provider approves.  Return to your normal activities as told by your health care provider. Ask your health care provider what activities are safe for you. This information is not intended to replace advice given to you by your health care provider. Make sure you discuss any questions you have with your health care provider. Document Released: 11/27/2012 Document Revised: 02/23/2016 Document Reviewed: 02/23/2016 Elsevier Interactive Patient Education  2019 [employer].

## 2018-02-22 NOTE — Progress Notes (Signed)
Consult Note: Gyn-Onc  Consult was requested by Dr. Corinna Capra for the evaluation of Felicia Bryant 63 y.o. female  CC:  Chief Complaint  Patient presents with  . Endometrial adenocarcinoma [facility])    Assessment/Plan:  Felicia. Felicia Bryant  is a 63 y.o.  year old with grade 1 endometrioid endometrial adenocarcinoma.   The patient is extraordinarily fearful of surgery, particularly general anesthesia, given her negative experience that she has had in the past which is attributed to general anesthesia and endotracheal tube.  For this reason she desires exploration of nonsurgical options for treatment of her cancer.  I outlined various treatment strategies for endometrial cancer including use of progestin therapy (open versus intrauterine device), primary radiation therapy, and surgery including either minimally invasive hysterectomy with staging versus vaginal hysterectomy without staging.  I explained that the gold standard intervention in a patient of her general health and with her diagnosis in accordance with NCCN guidelines would be a minimally invasive hysterectomy with staging.  I explained that this would be the most definitive course of action and would provide the most information regarding the need for adjuvant therapy.  All other options would sacrifice some degree of thoroughness in diagnostic or therapeutic virtue.  However it is not unreasonable to explore a trial of hormonal therapy with a progestin releasing IUD.  She seems most interested in this.  At her request we repeated the endometrial biopsy today to identify if there was any apparent residual carcinoma in the endometrium.  She will return to see me in approximately 1 month and discussed these results with the plan to move forward when she is determine how she would like to proceed.  If she proceeds with surgery we will have her meet with anesthesia prior to the surgical date so that she can discuss her concerns and reactions in the past  anesthesia.   HPI: Felicia Bryant is a 63 year old P3 who is seen in consultation at the request of Dr Corinna Capra for grade 1 endometrial cancer.  The patient had a single episode of vaginal bleeding in November 2019.  She was seen by her provider who performed a sonohysterogram which identified endometrial thickening some polyps and then proceeded to perform an in office hysteroscopy D&C and polypectomy.  The final pathology from this procedure revealed endometrioid adenocarcinoma FIGO grade 1.  The patient expressed skepticism regarding the validity of this pathology finding.  Patient is otherwise objectively somewhat healthy woman with her major past medical history including asthma and anxiety.  She has a history of obesity which is now resolved with aggressive weight loss attempts through dietary changes.  Her past surgical history is significant for a prior laparoscopic cholecystectomy performed at [facility].  This resulted in what the patient reports as persistent reactive airway disease with asthma due to a reaction to something in the endotracheal tube.  She reports that she is allergic to almost all antibiotics.  She has had 3 prior vaginal deliveries.  She has a remote history of a cervical cone biopsy.  Current Meds:  Outpatient Encounter Medications as of 02/22/2018  Medication Sig  . Cholecalciferol (VITAMIN D PO) Take 5,000 Units by mouth daily.   . Methylcobalamin 1000 MCG SUBL Place 1,000 mcg under the tongue 4 (four) times a week.  . [DISCONTINUED] albuterol (PROVENTIL) (2.5 MG/3ML) 0.083% nebulizer solution Take 2.5 mg by nebulization every 6 (six) hours as needed for wheezing or shortness of breath.  . [DISCONTINUED] Cyanocobalamin (VITAMIN B-12) 1000  MCG SUBL Place 1,000 mcg under the tongue 4 (four) times a week.    No facility-administered encounter medications on file as of 02/22/2018.     Allergy:  Allergies  Allergen Reactions  . Benadryl [Diphenhydramine Hcl  (Sleep)] Shortness Of Breath    iv  . Ciprofloxacin Other (See Comments)    Pain in stomach  . Codeine     Pt reports "It makes me having panting breathing and my feet and hands go numb"  . Depo-Medrol [Methylprednisolone Acetate] Other (See Comments)    Racing heart, and flushing  . Fluzone [Flu Virus Vaccine] Other (See Comments)    seizures  . Iodinated Diagnostic Agents     heart  . Iodine     nausea  . Levaquin [Levofloxacin]     Heart rate 120's Tendon pops  . Percocet [Oxycodone-Acetaminophen]     Pt reports "It makes me having panting breathing and my feet and hands go numb"  . Bactrim [Sulfamethoxazole-Trimethoprim] Rash  . Keflex [Cephalexin] Rash    Social Hx:   Social History   Socioeconomic History  . Marital status: Married    Spouse name: Not on file  . Number of children: Not on file  . Years of education: Not on file  . Highest education level: Not on file  Occupational History  . Not on file  Social Needs  . Financial resource strain: Not on file  . Food insecurity:    Worry: Not on file    Inability: Not on file  . Transportation needs:    Medical: Not on file    Non-medical: Not on file  Tobacco Use  . Smoking status: Never Smoker  . Smokeless tobacco: Never Used  Substance and Sexual Activity  . Alcohol use: No  . Drug use: No  . Sexual activity: Yes    Birth control/protection: None, Post-menopausal  Lifestyle  . Physical activity:    Days per week: Not on file    Minutes per session: Not on file  . Stress: Not on file  Relationships  . Social connections:    Talks on phone: Not on file    Gets together: Not on file    Attends religious service: Not on file    Active member of club or organization: Not on file    Attends meetings of clubs or organizations: Not on file    Relationship status: Not on file  . Intimate partner violence:    Fear of current or ex partner: Not on file    Emotionally abused: Not on file    Physically  abused: Not on file    Forced sexual activity: Not on file  Other Topics Concern  . Not on file  Social History Narrative  . Not on file    Past Surgical Hx:  Past Surgical History:  Procedure Laterality Date  . CATARACT EXTRACTION W/PHACO Right 11/30/2014   Procedure: CATARACT EXTRACTION PHACO AND INTRAOCULAR LENS PLACEMENT ([place]);  Surgeon: Tonny Branch, MD;  Location: [facility];  Service: Ophthalmology;  Laterality: Right;  CDE:10.59  . CHOLECYSTECTOMY    . conization of cervix    . DILATION AND CURETTAGE OF UTERUS     episiotomy repair also.  . TONSILLECTOMY      Past Medical Hx:  Past Medical History:  Diagnosis Date  . Asthma   . Complication of anesthesia    pt states i have had trouble with my throat closing after i had a tube down my throat  before."  . Palpitations    seen Dr.Skains in 2009..holter with pac's  . Palpitations    echocardiogram in 4235 with diastolic dysfunction EF 57 %  . Seizures ([place])    had seizure as child from vaccines and has had not seizures since was a child'.    Past Gynecological History:  None.  No LMP recorded. Patient is postmenopausal.  Family Hx:  Family History  Problem Relation Age of Onset  . Dementia Mother   . Heart attack Father   . Stroke Neg Hx     Review of Systems:  Constitutional  Feels well,    ENT Normal appearing ears and nares bilaterally Skin/Breast  No rash, sores, jaundice, itching, dryness Cardiovascular  No chest pain, shortness of breath, or edema  Pulmonary  No cough or wheeze.  Gastro Intestinal  No nausea, vomitting, or diarrhoea. No bright red blood per rectum, no abdominal pain, change in bowel movement, or constipation.  Genito Urinary  No frequency, urgency, dysuria, no further bleeding. Musculo Skeletal  No myalgia, arthralgia, joint swelling or pain  Neurologic  No weakness, numbness, change in gait,  Psychology  No depression, anxiety, insomnia.   Vitals:  Blood pressure (!) 111/59,  pulse 82, temperature 98.3 F (36.8 C), temperature source Oral, resp. rate 18, height 5' 5.5" (1.664 m), weight 153 lb (69.4 kg), SpO2 100 %.  Physical Exam: WD in NAD Neck  Supple NROM, without any enlargements.  Lymph Node Survey No cervical supraclavicular or inguinal adenopathy Cardiovascular  Pulse normal rate, regularity and rhythm. S1 and S2 normal.  Lungs  Clear to auscultation bilateraly, without wheezes/crackles/rhonchi. Good air movement.  Skin  No rash/lesions/breakdown  Psychiatry  Alert and oriented to person, place, and time  Abdomen  Normoactive bowel sounds, abdomen soft, non-tender and non-obese without evidence of hernia.  Back No CVA tenderness Genito Urinary  Vulva/vagina: Normal external female genitalia.   No lesions. No discharge or bleeding.  Bladder/urethra:  No lesions or masses, well supported bladder  Vagina: normal  Cervix: Normal appearing, no lesions.  Uterus:  Small, mobile, no parametrial involvement or nodularity.  Adnexa: no palpable masses. Rectal  deferred Extremities  No bilateral cyanosis, clubbing or edema.  Procedure Note:  Preop Dx: endometrial cancer Postop Dx: same Procedure: endometrial biopsy Surgeon: Dorann Ou, MD EBL: minimal Specimens: endometrial biopsy Complications: none Procedure Details: The patient provided verbal consent, timeout was performed, the cervix was visualized with a speculum.  It was grasped with a tenaculum.  An Allis finder was inserted into the cervical loss.  An endometrial Pipelle then passed to a depth of 6 cm.  A small amount of endometrial tissue was extracted through suction.  The tenaculum was removed.  The exam was hemostatic.    Thereasa Solo, MD  02/22/2018, 4:52 PM

## 2018-02-26 ENCOUNTER — Telehealth: Payer: Self-pay | Admitting: *Deleted

## 2018-02-26 ENCOUNTER — Encounter (HOSPITAL_COMMUNITY): Payer: Self-pay

## 2018-02-26 NOTE — Telephone Encounter (Signed)
Patient called and left a message stating "I have decided not to have the IUD placed. Instead I would like to have the vaginal hysterometry with the doctor would recommended with a spinal block." Message given to both [place] and [place] [facility]

## 2018-02-27 ENCOUNTER — Telehealth: Payer: Self-pay | Admitting: *Deleted

## 2018-02-27 ENCOUNTER — Encounter (HOSPITAL_COMMUNITY)
Admission: RE | Admit: 2018-02-27 | Discharge: 2018-02-27 | Disposition: A | Discharge status: Home / Self Care | Payer: 59 | Source: Ambulatory Visit | Attending: Ophthalmology | Admitting: Ophthalmology

## 2018-02-27 NOTE — Telephone Encounter (Signed)
Called and spoke with Enid Derry at [facility], patient given a medical record number #[id number]

## 2018-03-01 MED ORDER — TETRACAINE HCL 0.5 % OP SOLN
OPHTHALMIC | Status: AC
  Filled 2018-03-01: qty 4

## 2018-03-01 MED ORDER — NEOMYCIN-POLYMYXIN-DEXAMETH 3.5-10000-0.1 OP SUSP
OPHTHALMIC | Status: AC
  Filled 2018-03-01: qty 5

## 2018-03-01 MED ORDER — CYCLOPENTOLATE-PHENYLEPHRINE 0.2-1 % OP SOLN
OPHTHALMIC | Status: AC
  Filled 2018-03-01: qty 2

## 2018-03-01 MED ORDER — LIDOCAINE HCL 3.5 % OP GEL
OPHTHALMIC | Status: AC
  Filled 2018-03-01: qty 1

## 2018-03-01 MED ORDER — PHENYLEPHRINE HCL 2.5 % OP SOLN
OPHTHALMIC | Status: AC
  Filled 2018-03-01: qty 15

## 2018-03-01 MED ORDER — LIDOCAINE HCL (PF) 1 % IJ SOLN
INTRAMUSCULAR | Status: AC
  Filled 2018-03-01: qty 2

## 2018-03-04 ENCOUNTER — Encounter (HOSPITAL_COMMUNITY)
Admission: RE | Disposition: A | Discharge status: Home / Self Care | Payer: Self-pay | Source: Home / Self Care | Attending: Ophthalmology

## 2018-03-04 ENCOUNTER — Encounter (HOSPITAL_COMMUNITY): Payer: Self-pay | Admitting: *Deleted

## 2018-03-04 ENCOUNTER — Ambulatory Visit (HOSPITAL_COMMUNITY): Payer: 59 | Admitting: Anesthesiology

## 2018-03-04 ENCOUNTER — Ambulatory Visit (HOSPITAL_COMMUNITY)
Admission: RE | Admit: 2018-03-04 | Discharge: 2018-03-04 | Disposition: A | Discharge status: Home / Self Care | Payer: 59 | Attending: Ophthalmology | Admitting: Ophthalmology

## 2018-03-04 DIAGNOSIS — H2512 Age-related nuclear cataract, left eye: Secondary | ICD-10-CM | POA: Diagnosis not present

## 2018-03-04 DIAGNOSIS — J45909 Unspecified asthma, uncomplicated: Secondary | ICD-10-CM | POA: Insufficient documentation

## 2018-03-04 DIAGNOSIS — Z79899 Other long term (current) drug therapy: Secondary | ICD-10-CM | POA: Diagnosis not present

## 2018-03-04 HISTORY — PX: CATARACT EXTRACTION W/PHACO: SHX586

## 2018-03-04 SURGERY — PHACOEMULSIFICATION, CATARACT, WITH IOL INSERTION
Anesthesia: Monitor Anesthesia Care | Site: Eye | Laterality: Left

## 2018-03-04 MED ORDER — LIDOCAINE HCL 3.5 % OP GEL
1.0000 "application " | Freq: Once | OPHTHALMIC | Status: AC
  Administered 2018-03-04: 1 via OPHTHALMIC

## 2018-03-04 MED ORDER — SODIUM CHLORIDE 0.9% FLUSH
10.0000 mL | INTRAVENOUS | Status: DC | PRN
  Administered 2018-03-04 (×2): 3 mL via INTRAVENOUS

## 2018-03-04 MED ORDER — CYCLOPENTOLATE-PHENYLEPHRINE 0.2-1 % OP SOLN
1.0000 [drp] | OPHTHALMIC | Status: AC
  Administered 2018-03-04 (×3): 1 [drp] via OPHTHALMIC

## 2018-03-04 MED ORDER — BSS IO SOLN
INTRAOCULAR | Status: DC | PRN
  Administered 2018-03-04: 15 mL

## 2018-03-04 MED ORDER — LIDOCAINE HCL (PF) 1 % IJ SOLN
INTRAMUSCULAR | Status: DC | PRN
  Administered 2018-03-04: .7 mL

## 2018-03-04 MED ORDER — LACTATED RINGERS IV SOLN: INTRAVENOUS | Status: DC

## 2018-03-04 MED ORDER — MIDAZOLAM HCL 2 MG/2ML IJ SOLN
INTRAMUSCULAR | Status: DC | PRN
  Administered 2018-03-04 (×2): 1 mg via INTRAVENOUS

## 2018-03-04 MED ORDER — TETRACAINE HCL 0.5 % OP SOLN
1.0000 [drp] | OPHTHALMIC | Status: AC
  Administered 2018-03-04 (×3): 1 [drp] via OPHTHALMIC

## 2018-03-04 MED ORDER — PROVISC 10 MG/ML IO SOLN
INTRAOCULAR | Status: DC | PRN
  Administered 2018-03-04: 0.85 mL via INTRAOCULAR

## 2018-03-04 MED ORDER — MIDAZOLAM HCL 2 MG/2ML IJ SOLN
INTRAMUSCULAR | Status: AC
  Filled 2018-03-04: qty 2

## 2018-03-04 MED ORDER — PHENYLEPHRINE HCL 2.5 % OP SOLN
1.0000 [drp] | OPHTHALMIC | Status: AC
  Administered 2018-03-04 (×3): 1 [drp] via OPHTHALMIC

## 2018-03-04 MED ORDER — EPINEPHRINE PF 1 MG/ML IJ SOLN
INTRAOCULAR | Status: DC | PRN
  Administered 2018-03-04: 500 mL

## 2018-03-04 SURGICAL SUPPLY — 12 items
CLOTH BEACON ORANGE TIMEOUT ST (SAFETY) ×2 IMPLANT
EYE SHIELD UNIVERSAL CLEAR (GAUZE/BANDAGES/DRESSINGS) ×2 IMPLANT
GLOVE BIOGEL PI IND STRL 6.5 (GLOVE) IMPLANT
GLOVE BIOGEL PI IND STRL 7.0 (GLOVE) IMPLANT
GLOVE BIOGEL PI INDICATOR 6.5 (GLOVE) ×2
GLOVE BIOGEL PI INDICATOR 7.0 (GLOVE) ×4
LENS ALC ACRYL/TECN (Ophthalmic Related) ×2 IMPLANT
PAD ARMBOARD 7.5X6 YLW CONV (MISCELLANEOUS) ×2 IMPLANT
SYRINGE LUER LOK 1CC (MISCELLANEOUS) ×2 IMPLANT
TAPE SURG TRANSPORE 1 IN (GAUZE/BANDAGES/DRESSINGS) IMPLANT
TAPE SURGICAL TRANSPORE 1 IN (GAUZE/BANDAGES/DRESSINGS) ×2
WATER STERILE IRR 250ML POUR (IV SOLUTION) ×2 IMPLANT

## 2018-03-04 NOTE — Anesthesia Preprocedure Evaluation (Signed)
Anesthesia Evaluation  Patient identified by MRN, date of birth, ID band Patient awake  General Assessment Comment:H/o VC Dysfx and RAD remotely   Reviewed: Allergy & Precautions, NPO status , Patient's Chart, lab work & pertinent test results  History of Anesthesia Complications (+) history of anesthetic complications  Airway Mallampati: II  TM Distance: >3 FB Neck ROM: Full    Dental no notable dental hx. (+) Teeth Intact   Pulmonary asthma ,    Pulmonary exam normal breath sounds clear to auscultation       Cardiovascular Exercise Tolerance: Good negative cardio ROS Normal cardiovascular examI Rhythm:Regular Rate:Normal  EF normal good ET   Neuro/Psych Seizures -, Well Controlled,  No meds - last Sz 63 years old  negative psych ROS   GI/Hepatic negative GI ROS, Neg liver ROS,   Endo/Other  negative endocrine ROS  Renal/GU negative Renal ROS  negative genitourinary   Musculoskeletal negative musculoskeletal ROS (+)   Abdominal   Peds negative pediatric ROS (+)  Hematology negative hematology ROS (+)   Anesthesia Other Findings   Reproductive/Obstetrics negative OB ROS Class 1 AdenoCA of uterus -awaiting therapy                              Anesthesia Physical Anesthesia Plan  ASA: II  Anesthesia Plan: MAC   Post-op Pain Management:    Induction: Intravenous  PONV Risk Score and Plan:   Airway Management Planned: Simple Face Mask and Nasal Cannula  Additional Equipment:   Intra-op Plan:   Post-operative Plan:   Informed Consent: I have reviewed the patients History and Physical, chart, labs and discussed the procedure including the risks, benefits and alternatives for the proposed anesthesia with the patient or authorized representative who has indicated his/her understanding and acceptance.   Dental advisory given  Plan Discussed with: CRNA  Anesthesia Plan  Comments:         Anesthesia Quick Evaluation

## 2018-03-04 NOTE — Anesthesia Postprocedure Evaluation (Signed)
Anesthesia Post Note  Patient: Felicia Bryant  Procedure(s) Performed: CATARACT EXTRACTION PHACO AND INTRAOCULAR LENS PLACEMENT ([facility]) (Left [place])  Patient location during evaluation: Short Stay Anesthesia Type: MAC Level of consciousness: awake and alert and patient cooperative Pain management: satisfactory to patient Vital Signs Assessment: post-procedure vital signs reviewed and stable Respiratory status: spontaneous breathing Cardiovascular status: stable Postop Assessment: no apparent nausea or vomiting Anesthetic complications: no     Last Vitals:  Vitals:   03/04/18 0646  BP: (!) 100/55  Resp: 20  Temp: 36.7 C  SpO2: 100%    Last Pain:  Vitals:   03/04/18 0646  TempSrc: Oral  PainSc: 0-No pain                 Aleli Navedo

## 2018-03-04 NOTE — H&P (Signed)
I have reviewed the H&P, the patient was re-examined, and I have identified no interval changes in medical condition and plan of care since the history and physical of record  

## 2018-03-04 NOTE — Discharge Instructions (Signed)

## 2018-03-04 NOTE — Anesthesia Procedure Notes (Signed)
Procedure Name: MAC Date/Time: 03/04/2018 7:29 AM Performed by: Vista Deck, CRNA Pre-anesthesia Checklist: Patient identified, Emergency Drugs available, Suction available, Timeout performed and Patient being monitored Patient Re-evaluated:Patient Re-evaluated prior to induction Oxygen Delivery Method: Nasal Cannula

## 2018-03-04 NOTE — Transfer of Care (Signed)
Immediate Anesthesia Transfer of Care Note  Patient: Felicia Bryant  Procedure(s) Performed: CATARACT EXTRACTION PHACO AND INTRAOCULAR LENS PLACEMENT (IOC) (Left Eye)  Patient Location: Short Stay  Anesthesia Type:MAC  Level of Consciousness: awake, alert  and patient cooperative  Airway & Oxygen Therapy: Patient Spontanous Breathing  Post-op Assessment: Report given to RN and Post -op Vital signs reviewed and stable  Post vital signs: Reviewed and stable  Last Vitals:  Vitals Value Taken Time  BP    Temp    Pulse    Resp    SpO2      Last Pain:  Vitals:   03/04/18 0646  TempSrc: Oral  PainSc: 0-No pain      Patients Stated Pain Goal: 7 (35/59/74 1638)  Complications: No apparent anesthesia complications

## 2018-03-04 NOTE — Op Note (Signed)
Date of Admission: 03/04/2018  Date of Surgery: 03/04/2018  Pre-Op Dx: Cataract Left  Eye  Post-Op Dx: Senile Nuclear Cataract  Left  Eye,  Dx Code H25.12  Surgeon: Tonny Branch, M.D.  Assistants: None  Anesthesia: Topical with MAC  Indications: Painless, progressive loss of vision with compromise of daily activities.  Surgery: Cataract Extraction with Intraocular lens Implant Left Eye  Discription: The patient had dilating drops and viscous lidocaine placed into the Left eye in the pre-op holding area. After transfer to the operating room, a time out was performed. The patient was then prepped and draped. Beginning with a 30m blade a paracentesis port was made at the surgeon's 2 o'clock position. The anterior chamber was then filled with 1% non-preserved lidocaine. This was followed by filling the anterior chamber with Provisc.  A 2.425mkeratome blade was used to make a clear corneal incision at the temporal limbus.  A bent cystatome needle was used to create a continuous tear capsulotomy. Hydrodissection was performed with balanced salt solution on a Fine canula. The lens nucleus was then removed using the phacoemulsification handpiece. Residual cortex was removed with the I&A handpiece. The anterior chamber and capsular bag were refilled with Provisc. A posterior chamber intraocular lens was placed into the capsular bag with it's injector. The implant was positioned with the Kuglan hook. The Provisc was then removed from the anterior chamber and capsular bag with the I&A handpiece. Stromal hydration of the main incision and paracentesis port was performed with BSS on a Fine canula. The wounds were tested for leak which was negative. The patient tolerated the procedure well. There were no operative complications. The patient was then transferred to the recovery room in stable condition.  Complications: None  Specimen: None  EBL: None  Prosthetic device: J&J Technis, PCB00, power 22.5, SN  [id number]

## 2018-03-05 ENCOUNTER — Inpatient Hospital Stay (HOSPITAL_BASED_OUTPATIENT_CLINIC_OR_DEPARTMENT_OTHER): Payer: 59 | Admitting: Gynecology

## 2018-03-05 ENCOUNTER — Encounter (HOSPITAL_COMMUNITY): Payer: Self-pay | Admitting: Ophthalmology

## 2018-03-05 ENCOUNTER — Other Ambulatory Visit (HOSPITAL_COMMUNITY): Payer: 59

## 2018-03-05 VITALS — BP 104/56 | HR 82 | Temp 98.0°F | Resp 16 | Ht 65.5 in | Wt 153.0 lb

## 2018-03-05 DIAGNOSIS — C541 Malignant neoplasm of endometrium: Secondary | ICD-10-CM

## 2018-03-05 NOTE — Patient Instructions (Signed)
Plan for a vaginal hysterectomy with Dr. Fermin Schwab at [facility] on March 12, 2018.  You will receive a phone call from [facility] for a pre-op appointment at [facility].  Please call for any needs.

## 2018-03-05 NOTE — Progress Notes (Signed)
Consult Note: Gyn-Onc  Consult was requested by Dr. Corinna Capra for the evaluation of Felicia Bryant 63 y.o. female  CC:  Chief Complaint  Patient presents with  . Endometrial adenocarcinoma [facility])    Assessment/Plan:  Felicia Bryant  is a 63 y.o.  year old with grade 1 endometrioid endometrial adenocarcinoma.   She has previously seen Dr. Everitt Amber in consultation.  Treatment options were explored and discussed in detail.  The patient does not want to have general anesthesia with endotracheal intubation given her bad outcome from her prior procedure.  After considering her options she is most inclined to proceed with a vaginal hysterectomy under spinal or epidural anesthesia..  We would attempt to perform a bilateral salpingo-oophorectomy as well.  She is aware that this approach is a compromise and not our first recommendation.  I did indicate to the patient and her husband that we are not always able to complete a bilateral salpingo-oophorectomy.  She does not wish to have a lymphadenectomy.  They are aware that if I cannot accomplish the vaginal hysterectomy we would complete the surgery through a Pfannenstiel incision.  They are aware that based on pathologic findings postoperative radiation and/or chemo might be recommended although given that this is a grade 1 lesion this is much less likely.  Risks of surgery including hemorrhage, infection, injury to adjacent viscera, thromboembolic complications, and anesthetic risks were reviewed and questions were answered.  The patient wishes to proceed with surgery on January 21.  [facility] for preoperative evaluation.  Interval history: Patient returns today after initially seen Dr. Everitt Amber on February 22, 2018.  She is now decided that she would like to go ahead with hysterectomy but would prefer to have it performed vaginally.  She has an extreme fear of intubation given her prior experience.  She is aware that vaginal  nephrectomy is not necessarily our #1 choice but that she would prefer hysterectomy versus treatment with a Mirena or progestins.  She recently had cataract surgery on her left eye.  Otherwise there is been no change in her medical history from initial evaluation.  HPI: Felicia Bryant is a 63 year old P3 who is seen in consultation at the request of Dr Corinna Capra for grade 1 endometrial cancer.  The patient had a single episode of vaginal bleeding in November 2019.  She was seen by her provider who performed a sonohysterogram which identified endometrial thickening some polyps and then proceeded to perform an in office hysteroscopy D&C and polypectomy.  The final pathology from this procedure revealed endometrioid adenocarcinoma FIGO grade 1.  The patient expressed skepticism regarding the validity of this pathology finding.  Patient is otherwise objectively somewhat healthy woman with her major past medical history including asthma and anxiety.  She has a history of obesity which is now resolved with aggressive weight loss attempts through dietary changes.  Her past surgical history is significant for a prior laparoscopic cholecystectomy performed at [facility].  This resulted in what the patient reports as persistent reactive airway disease with asthma due to a reaction to something in the endotracheal tube.  She reports that she is allergic to almost all antibiotics.  She has had 3 prior vaginal deliveries.  She has a remote history of a cervical cone biopsy.  At the initial visit Dr. Denman George obtained a second endometrial biopsy that also showed a grade 1 endometrial cancer.  Current Meds:  Outpatient Encounter Medications  as of 03/05/2018  Medication Sig  . Cholecalciferol (VITAMIN D PO) Take 5,000 Units by mouth daily.   . Methylcobalamin 1000 MCG SUBL Place 1,000 mcg under the tongue 4 (four) times a week.   No facility-administered encounter medications on file as of 03/05/2018.      Allergy:  Allergies  Allergen Reactions  . Benadryl [Diphenhydramine Hcl (Sleep)] Shortness Of Breath    iv  . Ciprofloxacin Other (See Comments)    Pain in stomach  . Codeine     Pt reports "It makes me having panting breathing and my feet and hands go numb"  . Depo-Medrol [Methylprednisolone Acetate] Other (See Comments)    Racing heart, and flushing  . Fluzone [Flu Virus Vaccine] Other (See Comments)    seizures  . Iodinated Diagnostic Agents     heart  . Iodine     nausea  . Levaquin [Levofloxacin]     Heart rate 120's Tendon pops  . Macrobid [Nitrofurantoin Macrocrystal] Nausea And Vomiting  . Percocet [Oxycodone-Acetaminophen]     Pt reports "It makes me having panting breathing and my feet and hands go numb"  . Amoxicillin Rash  . Bactrim [Sulfamethoxazole-Trimethoprim] Rash  . Keflex [Cephalexin] Rash    Social Hx:   Social History   Socioeconomic History  . Marital status: Married    Spouse name: Not on file  . Number of children: Not on file  . Years of education: Not on file  . Highest education level: Not on file  Occupational History  . Not on file  Social Needs  . Financial resource strain: Not on file  . Food insecurity:    Worry: Not on file    Inability: Not on file  . Transportation needs:    Medical: Not on file    Non-medical: Not on file  Tobacco Use  . Smoking status: Never Smoker  . Smokeless tobacco: Never Used  Substance and Sexual Activity  . Alcohol use: No  . Drug use: No  . Sexual activity: Yes    Birth control/protection: None, Post-menopausal  Lifestyle  . Physical activity:    Days per week: Not on file    Minutes per session: Not on file  . Stress: Not on file  Relationships  . Social connections:    Talks on phone: Not on file    Gets together: Not on file    Attends religious service: Not on file    Active member of club or organization: Not on file    Attends meetings of clubs or organizations: Not on file     Relationship status: Not on file  . Intimate partner violence:    Fear of current or ex partner: Not on file    Emotionally abused: Not on file    Physically abused: Not on file    Forced sexual activity: Not on file  Other Topics Concern  . Not on file  Social History Narrative  . Not on file    Past Surgical Hx:  Past Surgical History:  Procedure Laterality Date  . CATARACT EXTRACTION W/PHACO Right 11/30/2014   Procedure: CATARACT EXTRACTION PHACO AND INTRAOCULAR LENS PLACEMENT ([place]);  Surgeon: Tonny Branch, MD;  Location: [facility];  Service: Ophthalmology;  Laterality: Right;  CDE:10.59  . CATARACT EXTRACTION W/PHACO Left 03/04/2018   Procedure: CATARACT EXTRACTION PHACO AND INTRAOCULAR LENS PLACEMENT (IOC);  Surgeon: Tonny Branch, MD;  Location: [facility];  Service: Ophthalmology;  Laterality: Left;  CDE: 8.40  . CHOLECYSTECTOMY    .  conization of cervix    . DILATION AND CURETTAGE OF UTERUS     episiotomy repair also.  . TONSILLECTOMY      Past Medical Hx:  Past Medical History:  Diagnosis Date  . Asthma   . Complication of anesthesia    pt states i have had trouble with my throat closing after i had a tube down my throat before."  . Palpitations    seen Dr.Skains in 2009..holter with pac's  . Palpitations    echocardiogram in 2595 with diastolic dysfunction EF 57 %  . Seizures ([place])    had seizure as child from vaccines and has had not seizures since was a child'.    Past Gynecological History:  None.  No LMP recorded. Patient is postmenopausal.  Family Hx:  Family History  Problem Relation Age of Onset  . Dementia Mother   . Heart attack Father   . Stroke Neg Hx     Review of Systems:  Constitutional  Feels well,    ENT Normal appearing ears and nares bilaterally Skin/Breast  No rash, sores, jaundice, itching, dryness Cardiovascular  No chest pain, shortness of breath, or edema  Pulmonary  No cough or wheeze.  Gastro Intestinal  No nausea, vomitting, or  diarrhoea. No bright red blood per rectum, no abdominal pain, change in bowel movement, or constipation.  Genito Urinary  No frequency, urgency, dysuria, no further bleeding. Musculo Skeletal  No myalgia, arthralgia, joint swelling or pain  Neurologic  No weakness, numbness, change in gait,  Psychology  No depression, anxiety, insomnia.   Vitals:  Blood pressure (!) 104/56, pulse 82, temperature 98 F (36.7 C), temperature source Oral, resp. rate 16, height 5' 5.5" (1.664 m), weight 153 lb (69.4 kg), SpO2 100 %.  Physical Exam: WD in NAD Neck  Supple NROM, without any enlargements.  Lymph Node Survey No cervical supraclavicular or inguinal adenopathy Cardiovascular  Pulse normal rate, regularity and rhythm. S1 and S2 normal.  Lungs  Clear to auscultation bilateraly, without wheezes/crackles/rhonchi. Good air movement.  Skin  No rash/lesions/breakdown  Psychiatry  Alert and oriented to person, place, and time  Abdomen  Normoactive bowel sounds, abdomen soft, non-tender and non-obese without evidence of hernia.  Back No CVA tenderness Genito Urinary  Vulva/vagina: Normal external female genitalia.   No lesions. No discharge or bleeding.  .  Bladder/urethra:  No lesions or masses, well supported bladder  Vagina: normal with slight atrophy.  Cervix: Normal appearing, no lesions.  Uterus:  Small, mobile, no parametrial involvement or nodularity.  Adnexa: no palpable masses. Rectal  deferred Extremities  No bilateral cyanosis, clubbing or edema.         Marti Sleigh, MD  03/05/2018, 10:02 AM

## 2018-03-06 ENCOUNTER — Ambulatory Visit: Payer: 59 | Admitting: Gynecologic Oncology

## 2018-03-08 DIAGNOSIS — C541 Malignant neoplasm of endometrium: Secondary | ICD-10-CM | POA: Diagnosis not present

## 2018-03-08 DIAGNOSIS — Z01818 Encounter for other preprocedural examination: Secondary | ICD-10-CM | POA: Diagnosis not present

## 2018-03-12 DIAGNOSIS — C541 Malignant neoplasm of endometrium: Secondary | ICD-10-CM | POA: Diagnosis not present

## 2018-03-12 DIAGNOSIS — J45909 Unspecified asthma, uncomplicated: Secondary | ICD-10-CM | POA: Diagnosis not present

## 2018-03-13 DIAGNOSIS — R109 Unspecified abdominal pain: Secondary | ICD-10-CM | POA: Diagnosis not present

## 2018-03-13 DIAGNOSIS — G8918 Other acute postprocedural pain: Secondary | ICD-10-CM | POA: Diagnosis not present

## 2018-03-15 ENCOUNTER — Ambulatory Visit: Payer: 59 | Admitting: Gynecologic Oncology

## 2018-03-28 DIAGNOSIS — C541 Malignant neoplasm of endometrium: Secondary | ICD-10-CM | POA: Diagnosis not present

## 2018-04-16 ENCOUNTER — Ambulatory Visit: Payer: 59 | Admitting: Gynecology

## 2018-04-30 ENCOUNTER — Inpatient Hospital Stay: Payer: 59 | Attending: Gynecologic Oncology | Admitting: Gynecology

## 2018-04-30 ENCOUNTER — Encounter: Payer: Self-pay | Admitting: Gynecology

## 2018-04-30 VITALS — BP 126/73 | HR 88 | Temp 98.2°F | Resp 18 | Ht 65.5 in | Wt 150.0 lb

## 2018-04-30 DIAGNOSIS — C541 Malignant neoplasm of endometrium: Secondary | ICD-10-CM

## 2018-04-30 DIAGNOSIS — Z90710 Acquired absence of both cervix and uterus: Secondary | ICD-10-CM

## 2018-04-30 NOTE — Patient Instructions (Signed)
Plan to follow up in six months or sooner if needed.  Please call in June or July to schedule an appointment for September 2020 at [phone].

## 2018-04-30 NOTE — Progress Notes (Signed)
Consult Note: Gyn-Onc  Consult was requested by Dr. Corinna Bryant for the evaluation of Felicia Bryant 63 y.o. female  CC:  Chief Complaint  Patient presents with  . Endometrial adenocarcinoma (HCC)    Assessment/Plan:  Stage Ia grade 1 endometrial adenocarcinoma status post total vaginal hysterectomy March 12, 2018.  Patient has good prognostic features and does not require any adjuvant therapy.  She has had an uncomplicated postoperative course and was given the okay to return to full levels of activity.  She is encouraged use a lubricant for vaginal intercourse.  She return to see me in 6 months.  Interval history: Patient returns today for 6-week postoperative check.  The patient underwent vaginal hysterectomy on March 12, 2018 for an endometrial carcinoma.  Final pathology showed this to be a stage Ia grade 1 endometrioid carcinoma.  We were unable to remove her tubes and ovaries transvaginally.  The patient has had an uncomplicated postoperative course.  She is return to having moderate levels of exercise in the gym.  She is eager to return to full levels of activity.  She denies any pelvic pain pressure bleeding or discharge.  She has no other GI or GU symptoms.  HPI: [name] is a 63 year old P3 who is seen in consultation at the request of Dr Felicia Bryant for grade 1 endometrial cancer.  The patient had a single episode of vaginal bleeding in November 2019.  She was seen by her provider who performed a sonohysterogram which identified endometrial thickening some polyps and then proceeded to perform an in office hysteroscopy D&C and polypectomy.  The final pathology from this procedure revealed endometrioid adenocarcinoma FIGO grade 1.  The patient expressed skepticism regarding the validity of this pathology finding.  Patient is otherwise objectively somewhat healthy woman with her major past medical history including asthma and anxiety.  She has a history of obesity which is now resolved with  aggressive weight loss attempts through dietary changes.  Her past surgical history is significant for a prior laparoscopic cholecystectomy performed at [facility].  This resulted in what the patient reports as persistent reactive airway disease with asthma due to a reaction to something in the endotracheal tube.  She reports that she is allergic to almost all antibiotics.  She has had 3 prior vaginal deliveries.  She has a remote history of a cervical cone biopsy.  At the initial visit Dr. Denman Bryant obtained a second endometrial biopsy that also showed a grade 1 endometrial cancer.  Patient underwent vaginal hysterectomy (at her request) on March 12, 2018 at [facility].  Final pathology showed this to be a stage Ia grade 1 endometrial carcinoma.  Current Meds:  Outpatient Encounter Medications as of 04/30/2018  Medication Sig  . Cholecalciferol (VITAMIN D PO) Take 5,000 Units by mouth daily.   . Methylcobalamin 1000 MCG SUBL Place 1,000 mcg under the tongue 4 (four) times a week.   No facility-administered encounter medications on file as of 04/30/2018.     Allergy:  Allergies  Allergen Reactions  . Benadryl [Diphenhydramine Hcl (Sleep)] Shortness Of Breath    iv  . Hemophilus B Polysaccharide Vaccine     Other reaction(s): Other (See Comments) seizures  . Promethazine Nausea And Vomiting  . Ciprofloxacin Other (See Comments)    Pain in stomach  . Codeine     Pt reports "It makes me having panting breathing and my feet and hands go numb"  . Depo-Medrol [Methylprednisolone Acetate] Other (See Comments)  Racing heart, and flushing  . Fluzone [Flu Virus Vaccine] Other (See Comments)    Heart Racing/Passed out.  . Iodinated Diagnostic Agents     heart heart  . Iodine     nausea  . Levaquin [Levofloxacin]     Heart rate 120's Tendon pops  . Macrobid [Nitrofurantoin Macrocrystal] Nausea And Vomiting  . Percocet [Oxycodone-Acetaminophen]     Pt reports "It makes me  having panting breathing and my feet and hands go numb"  . Amoxicillin Rash  . Bactrim [Sulfamethoxazole-Trimethoprim] Rash  . Keflex [Cephalexin] Rash    Social Hx:   Social History   Socioeconomic History  . Marital status: Married    Spouse name: Not on file  . Number of children: Not on file  . Years of education: Not on file  . Highest education level: Not on file  Occupational History  . Not on file  Social Needs  . Financial resource strain: Not on file  . Food insecurity:    Worry: Not on file    Inability: Not on file  . Transportation needs:    Medical: Not on file    Non-medical: Not on file  Tobacco Use  . Smoking status: Never Smoker  . Smokeless tobacco: Never Used  Substance and Sexual Activity  . Alcohol use: No  . Drug use: No  . Sexual activity: Yes    Birth control/protection: None, Post-menopausal  Lifestyle  . Physical activity:    Days per week: Not on file    Minutes per session: Not on file  . Stress: Not on file  Relationships  . Social connections:    Talks on phone: Not on file    Gets together: Not on file    Attends religious service: Not on file    Active member of club or organization: Not on file    Attends meetings of clubs or organizations: Not on file    Relationship status: Not on file  . Intimate partner violence:    Fear of current or ex partner: Not on file    Emotionally abused: Not on file    Physically abused: Not on file    Forced sexual activity: Not on file  Other Topics Concern  . Not on file  Social History Narrative  . Not on file    Past Surgical Hx:  Past Surgical History:  Procedure Laterality Date  . CATARACT EXTRACTION W/PHACO Right 11/30/2014   Procedure: CATARACT EXTRACTION PHACO AND INTRAOCULAR LENS PLACEMENT ([place]);  Surgeon: Tonny Branch, MD;  Location: [facility];  Service: Ophthalmology;  Laterality: Right;  CDE:10.59  . CATARACT EXTRACTION W/PHACO Left 03/04/2018   Procedure: CATARACT EXTRACTION PHACO  AND INTRAOCULAR LENS PLACEMENT (IOC);  Surgeon: Tonny Branch, MD;  Location: [facility];  Service: Ophthalmology;  Laterality: Left;  CDE: 8.40  . CHOLECYSTECTOMY    . conization of cervix    . DILATION AND CURETTAGE OF UTERUS     episiotomy repair also.  . TONSILLECTOMY      Past Medical Hx:  Past Medical History:  Diagnosis Date  . Asthma   . Complication of anesthesia    pt states i have had trouble with my throat closing after i had a tube down my throat before."  . Palpitations    seen Dr.Skains in 2009..holter with pac's  . Palpitations    echocardiogram in 6222 with diastolic dysfunction EF 57 %  . Seizures ([place])    had seizure as child from vaccines and  has had not seizures since was a child'.    Past Gynecological History:  None.  No LMP recorded. Patient is postmenopausal.  Family Hx:  Family History  Problem Relation Age of Onset  . Dementia Mother   . Heart attack Father   . Stroke Neg Hx     Review of Systems: 10 point comprehensive review of systems negative except as noted above.  Vitals:  Blood pressure 126/73, pulse 88, temperature 98.2 F (36.8 C), temperature source Oral, resp. rate 18, height 5' 5.5" (1.664 m), weight 150 lb (68 kg), SpO2 100 %.  Physical Exam: WD in NAD Neck  Supple NROM, without any enlargements.  Lymph Node Survey No cervical supraclavicular or inguinal adenopathy Cardiovascular  Pulse normal rate, regularity and rhythm. S1 and S2 normal.  Lungs  Clear to auscultation bilateraly, without wheezes/crackles/rhonchi. Good air movement.  Skin  No rash/lesions/breakdown  Psychiatry  Alert and oriented to person, place, and time  Abdomen  Normoactive bowel sounds, abdomen soft, non-tender and non-obese without evidence of hernia.  Back No CVA tenderness Genito Urinary  Vulva/vagina: Normal external female genitalia.   No lesions. No discharge or bleeding.  .  Bladder/urethra:  No lesions or masses, well supported bladder  Vagina:  normal with slight atrophy, the vaginal cuff is well-healed..  Cervix: Surgically absent.  Uterus: Surgically absent  Adnexa: no palpable masses. Rectal  deferred Extremities  No bilateral cyanosis, clubbing or edema.         Marti Sleigh, MD  04/30/2018, 10:50 AM

## 2020-02-12 ENCOUNTER — Other Ambulatory Visit: Payer: Self-pay

## 2020-02-12 ENCOUNTER — Telehealth: Payer: Self-pay | Admitting: Physician Assistant

## 2020-02-12 ENCOUNTER — Encounter (HOSPITAL_COMMUNITY): Payer: Self-pay

## 2020-02-12 ENCOUNTER — Emergency Department (HOSPITAL_COMMUNITY): Payer: 59

## 2020-02-12 ENCOUNTER — Emergency Department (HOSPITAL_COMMUNITY)
Admission: EM | Admit: 2020-02-12 | Discharge: 2020-02-12 | Disposition: A | Discharge status: Home / Self Care | Payer: 59 | Attending: Emergency Medicine | Admitting: Emergency Medicine

## 2020-02-12 DIAGNOSIS — Z79899 Other long term (current) drug therapy: Secondary | ICD-10-CM | POA: Insufficient documentation

## 2020-02-12 DIAGNOSIS — R739 Hyperglycemia, unspecified: Secondary | ICD-10-CM | POA: Diagnosis not present

## 2020-02-12 DIAGNOSIS — J45909 Unspecified asthma, uncomplicated: Secondary | ICD-10-CM | POA: Insufficient documentation

## 2020-02-12 DIAGNOSIS — R072 Precordial pain: Secondary | ICD-10-CM | POA: Insufficient documentation

## 2020-02-12 DIAGNOSIS — M549 Dorsalgia, unspecified: Secondary | ICD-10-CM | POA: Insufficient documentation

## 2020-02-12 DIAGNOSIS — R0789 Other chest pain: Secondary | ICD-10-CM | POA: Diagnosis present

## 2020-02-12 LAB — BASIC METABOLIC PANEL
Anion gap: 11 (ref 5–15)
BUN: 9 mg/dL (ref 8–23)
CO2: 23 mmol/L (ref 22–32)
Calcium: 10 mg/dL (ref 8.9–10.3)
Chloride: 104 mmol/L (ref 98–111)
Creatinine, Ser: 0.7 mg/dL (ref 0.44–1.00)
GFR, Estimated: >60 mL/min (ref >60)
Glucose, Bld: 143 mg/dL — ABNORMAL HIGH (ref 70–99)
Potassium: 3.7 mmol/L (ref 3.5–5.1)
Sodium: 138 mmol/L (ref 135–145)

## 2020-02-12 LAB — CBC
HCT: 39.3 % (ref 36.0–46.0)
Hemoglobin: 13.2 g/dL (ref 12.0–15.0)
MCH: 31.1 pg (ref 26.0–34.0)
MCHC: 33.6 g/dL (ref 30.0–36.0)
MCV: 92.5 fL (ref 80.0–100.0)
Platelets: 269 10*3/uL (ref 150–400)
RBC: 4.25 MIL/uL (ref 3.87–5.11)
RDW: 12.1 % (ref 11.5–15.5)
WBC: 6.5 10*3/uL (ref 4.0–10.5)
nRBC: 0 % (ref 0.0–0.2)

## 2020-02-12 LAB — TROPONIN I (HIGH SENSITIVITY)
Troponin I (High Sensitivity): 3 ng/L (ref <18)
Troponin I (High Sensitivity): 4 ng/L (ref <18)

## 2020-02-12 MED ORDER — ALUM & MAG HYDROXIDE-SIMETH 200-200-20 MG/5ML PO SUSP
30.0000 mL | Freq: Once | ORAL | Status: AC
  Administered 2020-02-12: 16:00:00 30 mL via ORAL
  Filled 2020-02-12: qty 30

## 2020-02-12 NOTE — ED Notes (Signed)
Discharge instructions provided to patient. Verbalized understanding. Alert and oriented. Ambulated with steady gait out of ED with family. 

## 2020-02-12 NOTE — Telephone Encounter (Signed)
Spoke with pt who states she continues to have some "chest pressure/discomfort." She wants to be seen today. I explained to her if she were still having chest discomfort, she needs to seek emergency care and evaluation. If she were to be having an acute issue, she would need emergency care.   Pt states she is "at risk" to visit the ED and does not want the COVID shot because of a past vaccine reaction. I advised her to wear a good mask, social distance, and wash her hands if she had to visit the ED. She was strongly advised to seek emergency care for chest discomfort despite COVID risks.   I was able to move her appt up to Jan 4th from the 11th for evaluation. I stated if she went to the ED for evaluation, they could rule out an acute episode and this could better help with a plan during her upcoming appt with Dr. Marlou Porch.   She has verbalized understanding and had no additional questions.

## 2020-02-12 NOTE — Telephone Encounter (Signed)
Returned call to patient who reports 1 week ago felt a "burst" through ?chest and then released. No other symptoms. Having sharp and squeezing chest pain since then. Feels like heart is twisting in knots. Chest discomfort does not occur when she does treadmill x 20 min. Chest discomfort generally when at rest, lasts 5 seconds and is happening several times daily and can wake her from sleep.   I recommended evaluation in the ER. She does not want to go to the ER because "they have given me a flu shot in my IV and I struggled for my life." She also states she needs to know that no one will try to give her a COVID vaccine. I assured her that she wouldn't be given any medication that she refuses. I reiterated that if she is having chest discomfort, she should be evaluated in the ER. She will call back at 8AM for an appt in the office today.  She also states she wants a repeat echo now to check on her heart failure. I let her know that we can schedule a non-urgent clinic visit for evaluation. Thankfully, she is not having symptoms of volume overload at this time.

## 2020-02-12 NOTE — ED Notes (Signed)
Assumed care of patient. Patient resting on stretcher in poc. Patient removing monitoring stating "I don't think I need this." Patient educated that she should be monitored while in ED due to her chief complaint. Patient stating she will be going home once blood work is complete and that she is ok. Will continue to monitor.

## 2020-02-12 NOTE — Telephone Encounter (Signed)
Felicia Bryant is calling back requesting to be worked in today to see someone in regards to this. Please advise.

## 2020-02-12 NOTE — Discharge Instructions (Signed)
Keep your appointment with cardiology  Return for new or worsening symptoms

## 2020-02-12 NOTE — ED Provider Notes (Signed)
[facility] EMERGENCY DEPARTMENT Provider Note   CSN: [id number] Arrival date & time: 02/12/20  [id number]    History Chief Complaint  Patient presents with  . Chest Pain    Felicia Bryant is a 64 y.o. female with past medical history significant for palpitations, followed by Dr. Marlou Porch who presents for evaluation of chest pain.  Patient states over the last 2 weeks she has had intermittent chest pain.  Occurs randomly."  No exertional pleuritic component.  Describes as sharp, aching, pressure.  Does not radiate into jaw, neck, left arm.  Has been able to do her daily 20-minute, 1 mile walk on the treadmill.  She has no history of hypertension, hyperlipidemia, diabetes.  States she has been under stress at work.  Had been seen previously for palpitations however has not been having these.  No lateral leg swelling, redness or warmth.  No recent surgery or immobilization.  No prior history of clotting disorders, DVT or PE.  Call Dr. Kingsley Plan office and they told her to proceed to the emergency department for evaluation.  She does have appointment for follow-up on 02/24/2019.  Echocardiogram 05/2017 with EF 60-65%.  Prior to 2 weeks ago she had never had any chest pain.  States she does not currently have any chest pain.  States 2 weeks ago she did experience a "rush of pain" across her chest pain from the right to the left.  Has not had this again.  He does not feel that she is anxious.  She follows a plant-based diet.  Headache, lightheadedness, dizziness, neck pain, cough, hemoptysis abdominal pain, lateral leg swelling, redness or warmth.  Does get occasional left sided back pain however this is chronic.  No paresthesias or weakness.  Patient denies any associated diaphoresis, nausea, vomiting, paresthesias or weakness when she gets these pains.  Denies additional aggravating or alleviating factors.    History obtained from patient and past medical records.  No interpreter used.  HPI  HPI: A  65 year old patient presents for evaluation of chest pain. Initial onset of pain was more than 6 hours ago. The patient's chest pain is well-localized and is not worse with exertion. The patient's chest pain is middle- or left-sided, is not described as heaviness/pressure/tightness, is not sharp and does not radiate to the arms/jaw/neck. The patient does not complain of nausea and denies diaphoresis. The patient has no history of stroke, has no history of peripheral artery disease, has not smoked in the past 90 days, denies any history of treated diabetes, has no relevant family history of coronary artery disease (first degree relative at less than age 11), is not hypertensive, has no history of hypercholesterolemia and does not have an elevated BMI (>=30).   Past Medical History:  Diagnosis Date  . Asthma   . Complication of anesthesia    pt states i have had trouble with my throat closing after i had a tube down my throat before."  . Palpitations    seen Dr.Skains in 2009..holter with pac's  . Palpitations    echocardiogram in 123XX123 with diastolic dysfunction EF 57 %  . Seizures ([place])    had seizure as child from vaccines and has had not seizures since was a child'.    Patient Active Problem List   Diagnosis Date Noted  . Palpitations 05/24/2017    Past Surgical History:  Procedure Laterality Date  . CATARACT EXTRACTION W/PHACO Right 11/30/2014   Procedure: CATARACT EXTRACTION PHACO AND INTRAOCULAR LENS PLACEMENT ([place]);  Surgeon: Tonny Branch, MD;  Location: [facility];  Service: Ophthalmology;  Laterality: Right;  CDE:10.59  . CATARACT EXTRACTION W/PHACO Left 03/04/2018   Procedure: CATARACT EXTRACTION PHACO AND INTRAOCULAR LENS PLACEMENT ([facility]);  Surgeon: Tonny Branch, MD;  Location: [facility];  Service: Ophthalmology;  Laterality: Left;  CDE: 8.40  . CHOLECYSTECTOMY    . conization of cervix    . DILATION AND CURETTAGE OF UTERUS     episiotomy repair also.  . TONSILLECTOMY       OB History    No obstetric history on file.     Family History  Problem Relation Age of Onset  . Dementia Mother   . Heart attack Father   . Stroke Neg Hx     Social History   Tobacco Use  . Smoking status: Never Smoker  . Smokeless tobacco: Never Used  Vaping Use  . Vaping Use: Never used  Substance Use Topics  . Alcohol use: No  . Drug use: No    Home Medications Prior to Admission medications   Medication Sig Start Date End Date Taking? Authorizing Provider  albuterol (VENTOLIN HFA) 108 (90 Base) MCG/ACT inhaler Inhale 1-2 puffs into the lungs as needed for wheezing or shortness of breath. 11/13/19  Yes [provider]  Cholecalciferol (VITAMIN D PO) Take 5,000 Units by mouth daily.    Yes [provider]  Methylcobalamin 1000 MCG SUBL Place 1,000 mcg under the tongue 4 (four) times a week.   Yes [provider]  OVER THE COUNTER MEDICATION Take 14 drops by mouth daily. Algae based Omega 3   Yes [provider]    Allergies    Benadryl [diphenhydramine hcl (sleep)], Hemophilus b polysaccharide vaccine, Promethazine, Ciprofloxacin, Codeine, Depo-medrol [methylprednisolone sodium succ], Fluzone [influenza virus vaccine], Iodinated diagnostic agents, Iodine, Levaquin [levofloxacin], Macrobid [nitrofurantoin macrocrystal], Percocet [oxycodone-acetaminophen], Amoxicillin, Bactrim [sulfamethoxazole-trimethoprim], and Keflex [cephalexin]  Review of Systems   Review of Systems  Constitutional: Negative.   HENT: Negative.   Respiratory: Negative.   Cardiovascular: Positive for chest pain. Negative for palpitations and leg swelling.  Gastrointestinal: Negative.   Genitourinary: Negative.   Musculoskeletal: Negative.   Skin: Negative.   Neurological: Negative.   All other systems reviewed and are negative.   Physical Exam Updated Vital Signs BP 118/75 (BP Location: Left Arm)   Pulse 72   Temp 98.4 F (36.9 C) (Oral)   Resp 14   Ht 5\' 6"  (1.676 m)    Wt 69.4 kg   SpO2 100%   BMI 24.69 kg/m   Physical Exam Vitals and nursing note reviewed.  Constitutional:      General: She is not in acute distress.    Appearance: She is well-developed and well-nourished. She is not ill-appearing, toxic-appearing or diaphoretic.  HENT:     Head: Normocephalic and atraumatic.  Eyes:     Pupils: Pupils are equal, round, and reactive to light.  Cardiovascular:     Rate and Rhythm: Normal rate.     Pulses: Intact distal pulses.          Radial pulses are 2+ on the right side and 2+ on the left side.     Heart sounds: Normal heart sounds.  Pulmonary:     Effort: Pulmonary effort is normal. No respiratory distress.     Comments: Speaks in full sentences without difficulty.  Clear to auscultation bilaterally Abdominal:     General: Bowel sounds are normal. There is no distension.     Palpations: Abdomen  is soft.     Comments: Soft, nontender without rebound or guarding  Musculoskeletal:        General: Normal range of motion.     Cervical back: Normal range of motion.     Comments: Compartments soft.  No edema.  No bony tenderness.  Moves all 4 extremities without difficulty  Skin:    General: Skin is warm and dry.     Capillary Refill: Capillary refill takes less than 2 seconds.  Neurological:     General: No focal deficit present.     Mental Status: She is alert.     Comments: Cranial nerves II through XII grossly intact  Psychiatric:        Mood and Affect: Mood and affect normal.     ED Results / Procedures / Treatments   Labs (all labs ordered are listed, but only abnormal results are displayed) Labs Reviewed  BASIC METABOLIC PANEL - Abnormal; Notable for the following components:      Result Value   Glucose, Bld 143 (*)    All other components within normal limits  CBC  TROPONIN I (HIGH SENSITIVITY)  TROPONIN I (HIGH SENSITIVITY)    EKG EKG Interpretation  Date/Time:  Thursday February 12 2020 09:57:55 EST Ventricular  Rate:  90 PR Interval:  186 QRS Duration: 92 QT Interval:  350 QTC Calculation: 428 R Axis:   41 Text Interpretation: Normal sinus rhythm Normal ECG Confirmed by Isla Pence [phone]) on 02/12/2020 1:21:50 PM   Radiology DG Chest 2 View  Result Date: 02/12/2020 CLINICAL DATA:  Chest pain/tightness. EXAM: CHEST - 2 VIEW COMPARISON:  June 11, 2014. FINDINGS: The heart size and mediastinal contours are within normal limits. Both lungs are clear. No visible pleural effusions or pneumothorax. No acute osseous abnormality. Cholecystectomy clips. IMPRESSION: No active cardiopulmonary disease. Electronically Signed   By: Margaretha Sheffield MD   On: 02/12/2020 10:57    Procedures Procedures (including critical care time)  Medications Ordered in ED Medications  alum & mag hydroxide-simeth (MAALOX/MYLANTA) 200-200-20 MG/5ML suspension 30 mL (30 mLs Oral Given 02/12/20 1603)    ED Course  I have reviewed the triage vital signs and the nursing notes.  Pertinent labs & imaging results that were available during my care of the patient were reviewed by me and considered in my medical decision making (see chart for details).  64 year old with intermittent chest pain over the last 2 weeks.  Afebrile, nonseptic, non-ill-appearing.  Nonexertional nonpleuritic in nature.  Nonradiating.  No associated diaphoresis, nausea, vomiting.  No pain to back, left arm or left jaw.  She has been able to use a treadmill, 20 minutes daily without any chest pain.  Heart lungs clear.  Abdomen soft, nontender.  Compartments soft.  No unilateral leg swelling, redness or warmth.  No clinical evidence of DVT on exam.  Patient without any chest pain here in the emergency department.  We will plan on labs, imaging and reassess.  Labs and imaging personally reviewed and interpreted:  CBC without leukocytosis Metabolic panel with mild hyperglycemia to 143, no additional electrolyte, renal abnormality EKG without ischemic  changes Chest x-ray without cardiomegaly, pulmonary edema, pneumothorax, infectious process Trop 3>>4  Patient reassessed.  Continues to be without any chest pain.  Symptoms do not seem consistent with ACS, PE, dissection, infectious process.  Mild relief with GI cocktail.  Does have appointment to follow-up with cards in 2 weeks.  Heart score of 2.  She is Wells criteria low risk.  Patient is to be discharged with recommendation to follow up with Cardiology in regards to today's hospital visit. Chest pain is not likely of cardiac or pulmonary etiology d/t presentation, VSS, no tracheal deviation, no JVD or new murmur, RRR, breath sounds equal bilaterally, EKG without acute abnormalities, negative troponin, and negative CXR. Pt has been advised to return to the ED if CP becomes exertional, associated with diaphoresis or nausea, radiates to left jaw/arm, worsens or becomes concerning in any way. Pt appears reliable for follow up and is agreeable to discharge.   The patient has been appropriately medically screened and/or stabilized in the ED. I have low suspicion for any other emergent medical condition which would require further screening, evaluation or treatment in the ED or require inpatient management.  Patient is hemodynamically stable and in no acute distress.  Patient able to ambulate in department prior to ED.  Evaluation does not show acute pathology that would require ongoing or additional emergent interventions while in the emergency department or further inpatient treatment.  I have discussed the diagnosis with the patient and answered all questions.  Pain is been managed while in the emergency department and patient has no further complaints prior to discharge.  Patient is comfortable with plan discussed in room and is stable for discharge at this time.  I have discussed strict return precautions for returning to the emergency department.  Patient was encouraged to follow-up with  PCP/specialist refer to at discharge.  Patient seen and evaluated by attending, Dr. Gilford Raid who agrees with above treatment, plan and disposition.     MDM Rules/Calculators/A&P HEAR Score: 2                         Final Clinical Impression(s) / ED Diagnoses Final diagnoses:  Precordial pain    Rx / DC Orders ED Discharge Orders    None       Quamaine Webb A, PA-C 02/12/20 1656    Isla Pence, MD 02/13/20 [phone]

## 2020-02-12 NOTE — ED Triage Notes (Signed)
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 Pt a.o, nad noted

## 2020-02-16 NOTE — Telephone Encounter (Signed)
Thank you for the update.  Agree with plan. Candee Furbish, MD

## 2020-02-24 ENCOUNTER — Ambulatory Visit: Payer: 59 | Admitting: Cardiology

## 2020-03-03 ENCOUNTER — Ambulatory Visit: Payer: 59 | Admitting: Cardiology

## 2020-03-17 ENCOUNTER — Telehealth (INDEPENDENT_AMBULATORY_CARE_PROVIDER_SITE_OTHER): Payer: 59 | Admitting: Cardiology

## 2020-03-17 ENCOUNTER — Encounter: Payer: Self-pay | Admitting: *Deleted

## 2020-03-17 ENCOUNTER — Other Ambulatory Visit: Payer: Self-pay

## 2020-03-17 ENCOUNTER — Telehealth: Payer: Self-pay

## 2020-03-17 VITALS — Ht 66.0 in | Wt 154.0 lb

## 2020-03-17 DIAGNOSIS — R002 Palpitations: Secondary | ICD-10-CM

## 2020-03-17 DIAGNOSIS — R079 Chest pain, unspecified: Secondary | ICD-10-CM

## 2020-03-17 NOTE — Telephone Encounter (Signed)
Spoke with patient in regards to testing ordered by Dr. Marlou Porch ETT and ECHO.  Patient expressed that she will not do a COVID swab d/t carcinogens on tip of swab. She states she has a religious exemption.  She will do a saliva test that she says is FDA approved.   Also, expressed that she probably will not do ETT because she will have to wear a mask.  She expressed that she has a restrictive airway due to damage from a breathing tube and can not exercise with a mask on.  Dr.Skains to be notified.

## 2020-03-17 NOTE — Telephone Encounter (Signed)
Thanks for letting me know. We will do the best we can with what she will allow [place] to complete.  Candee Furbish, MD

## 2020-03-17 NOTE — Patient Instructions (Signed)
Medication Instructions:  Your physician recommends that you continue on your current medications as directed. Please refer to the Current Medication list given to you today.  *If you need a refill on your cardiac medications before your next appointment, please call your pharmacy*   Lab Work: NONE   Testing/Procedures: Your physician has requested that you have an echocardiogram. Echocardiography is a painless test that uses sound waves to create images of your heart. It provides your doctor with information about the size and shape of your heart and how well your heart's chambers and valves are working. This procedure takes approximately one hour. There are no restrictions for this procedure.  Your physician has requested that you have an exercise tolerance test. You will need to have a COVID screen prior to test.       COVID TEST-- You will go to [address] [place], [place] [address]  for your Covid testing.   This is a drive thru test site.   Be sure to share with the first checkpoint that you are there for pre-procedure/surgery testing. Stay in your car and the nurse team will come to your car to test you.  After you are tested please go home and self-quarantine until the day of your procedure.      Follow-Up:  Follow up based on results from Exericse tolerance test and ECHO. At [facility], you and your health needs are our priority.  As part of our continuing mission to provide you with exceptional heart care, we have created designated Provider Care Teams.  These Care Teams include your primary Cardiologist (physician) and Advanced Practice Providers (APPs -  Physician Assistants and Nurse Practitioners) who all work together to provide you with the care you need, when you need it.

## 2020-03-17 NOTE — Progress Notes (Signed)
Virtual Visit via Video Note   This visit type was conducted due to national recommendations for restrictions regarding the COVID-19 Pandemic (e.g. social distancing) in an effort to limit this patient's exposure and mitigate transmission in our community.  Due to her co-morbid illnesses, this patient is at least at moderate risk for complications without adequate follow up.  This format is felt to be most appropriate for this patient at this time.  All issues noted in this document were discussed and addressed.  A limited physical exam was performed with this format.  Please refer to the patient's chart for her consent to telehealth for [facility].       Date:  03/17/2020   ID:  Felicia Bryant, DOB 1955-02-25, MRN [id number] The patient was identified using 2 identifiers.  Patient Location: Home Provider Location: Home Office  PCP:  Sharilyn Sites, MD  Cardiologist:  No primary care provider on file.  Electrophysiologist:  None   Evaluation Performed:  Follow-Up Visit    History of Present Illness:    Felicia Bryant is a 65 y.o. female here for the follow-up of palpitations. Last seen in 2019.  Chest pain in ER. Normal HR 72. 116/65. [place] and let go. Dull. Bam.   She had described heart racing during stressful situations.  Had prior EKG unremarkable.  Chest x-ray normal hemoglobin normal.  Had a prior 70 pound weight loss on plant-based diet.  She even had a uterine cancer that benefited from her switch to plant-based diet.  Overall she is not having any further chest discomfort.  Her husband was present during the interview.  The patient does not have symptoms concerning for COVID-19 infection (fever, chills, cough, or new shortness of breath).    Past Medical History:  Diagnosis Date  . Asthma   . Complication of anesthesia    pt states i have had trouble with my throat closing after i had a tube down my throat before."  . Palpitations    seen Dr.Teon Hudnall in  2009..holter with pac's  . Palpitations    echocardiogram in 123XX123 with diastolic dysfunction EF 57 %  . Seizures ([place])    had seizure as child from vaccines and has had not seizures since was a child'.   Past Surgical History:  Procedure Laterality Date  . CATARACT EXTRACTION W/PHACO Right 11/30/2014   Procedure: CATARACT EXTRACTION PHACO AND INTRAOCULAR LENS PLACEMENT ([place]);  Surgeon: Tonny Branch, MD;  Location: [facility];  Service: Ophthalmology;  Laterality: Right;  CDE:10.59  . CATARACT EXTRACTION W/PHACO Left 03/04/2018   Procedure: CATARACT EXTRACTION PHACO AND INTRAOCULAR LENS PLACEMENT ([facility]);  Surgeon: Tonny Branch, MD;  Location: [facility];  Service: Ophthalmology;  Laterality: Left;  CDE: 8.40  . CHOLECYSTECTOMY    . conization of cervix    . DILATION AND CURETTAGE OF UTERUS     episiotomy repair also.  . TONSILLECTOMY       Current Meds  Medication Sig  . albuterol (VENTOLIN HFA) 108 (90 Base) MCG/ACT inhaler Inhale 1-2 puffs into the lungs as needed for wheezing or shortness of breath.  . Cholecalciferol (VITAMIN D PO) Take 5,000 Units by mouth daily.   . Methylcobalamin 1000 MCG SUBL Place 1,000 mcg under the tongue 4 (four) times a week.  Marland Kitchen OVER THE COUNTER MEDICATION Take 14 drops by mouth daily. Algae based Omega 3     Allergies:   Benadryl [diphenhydramine hcl (sleep)], Hemophilus b polysaccharide vaccine, Promethazine, Vitamin d analogs, Ciprofloxacin, Codeine, Depo-medrol [methylprednisolone  sodium succ], Fluzone [influenza virus vaccine], Iodinated diagnostic agents, Iodine, Levaquin [levofloxacin], Macrobid [nitrofurantoin macrocrystal], Percocet [oxycodone-acetaminophen], Amoxicillin, Bactrim [sulfamethoxazole-trimethoprim], and Keflex [cephalexin]   Social History   Tobacco Use  . Smoking status: Never Smoker  . Smokeless tobacco: Never Used  Vaping Use  . Vaping Use: Never used  Substance Use Topics  . Alcohol use: No  . Drug use: No     Family Hx: The  patient's family history includes Dementia in her mother; Heart attack in her father. There is no history of Stroke.  ROS:   Please see the history of present illness.    No fevers chills nausea vomiting syncope bleeding All other systems reviewed and are negative.   Prior CV studies:   The following studies were reviewed today:  Echocardiogram 2016 showed normal EF no other abnormalities.  Echocardiogram 05/25/2017:  - Left ventricle: The cavity size was normal. Systolic function was  normal. The estimated ejection fraction was in the range of 60%  to 65%. Wall motion was normal; there were no regional wall  motion abnormalities. Doppler parameters are consistent with  abnormal left ventricular relaxation (grade 1 diastolic  dysfunction).  - Aortic valve: Transvalvular velocity was within the normal range.  There was no stenosis. There was no regurgitation.  - Mitral valve: Transvalvular velocity was within the normal range.  There was no evidence for stenosis. There was trivial  regurgitation.  - Left atrium: The atrium was mildly dilated.  - Right ventricle: The cavity size was normal. Wall thickness was  normal. Systolic function was normal.  - Atrial septum: No defect or patent foramen ovale was identified  by color flow Doppler.  - Tricuspid valve: There was mild regurgitation.  - Pulmonary arteries: Systolic pressure was within the normal  range. PA peak pressure: 22 mm Hg (S).   Labs/Other Tests and Data Reviewed:    EKG: Prior EKG from 2019 shows normal sinus rhythm without any other issues  Recent Labs: 02/12/2020: BUN 9; Creatinine, Ser 0.70; Hemoglobin 13.2; Platelets 269; Potassium 3.7; Sodium 138   Recent Lipid Panel No results found for: CHOL, TRIG, HDL, CHOLHDL, LDLCALC, LDLDIRECT  Wt Readings from Last 3 Encounters:  03/17/20 154 lb (69.9 kg)  02/12/20 153 lb (69.4 kg)  04/30/18 150 lb (68 kg)     Risk Assessment/Calculations:       Objective:    Vital Signs:  Ht 5\' 6"  (1.676 m)   Wt 154 lb (69.9 kg)   BMI 24.86 kg/m    VITAL SIGNS:  reviewed GEN:  no acute distress EYES:  sclerae anicteric, EOMI - Extraocular Movements Intact RESPIRATORY:  normal respiratory effort, symmetric expansion SKIN:  no rash, lesions or ulcers. MUSCULOSKELETAL:  no obvious deformities. NEURO:  alert and oriented x 3, no obvious focal deficit PSYCH:  normal affect  ASSESSMENT & PLAN:    Chest pain -We will check an echocardiogram and an exercise treadmill test.  I had offered her a coronary CT scan or potentially a pharmacologic stress test however she does not do well with IV medications or contrast. -Thankfully the troponins were normal in the emergency room.  EKG did not show any evidence of ischemia.  Palpitations -Previously no high risk symptoms such as syncope.  Thought to be brought on by stressful factors like adrenaline rush.  I checked an echocardiogram at last visit.  Reassuring.  She was concerned about the grade 1 diastolic dysfunction seen on echocardiogram.  Explained to her that this can  be a natural part of the aging process with some stiffening of the ventricular musculature.  Should not be of any cause of alarm.   Shared Decision Making/Informed Consent The risks [chest pain, shortness of breath, cardiac arrhythmias, dizziness, blood pressure fluctuations, myocardial infarction, stroke/transient ischemic attack, and life-threatening complications (estimated to be 1 in 10,000)], benefits (risk stratification, diagnosing coronary artery disease, treatment guidance) and alternatives of an exercise tolerance test were discussed in detail with Ms. Moisan and she agrees to proceed.      COVID-19 Education: The signs and symptoms of COVID-19 were discussed with the patient and how to seek care for testing (follow up with PCP or arrange E-visit).  The importance of social distancing was discussed today.  Time:    Today, I have spent 21 minutes with the patient with telehealth technology discussing the above problems.     Medication Adjustments/Labs and Tests Ordered: Current medicines are reviewed at length with the patient today.  Concerns regarding medicines are outlined above.   Tests Ordered: Orders Placed This Encounter  Procedures  . Cardiac Stress Test: Informed Consent Details: Physician/Practitioner Attestation; Transcribe to consent form and obtain patient signature  . EXERCISE TOLERANCE TEST (ETT)  . ECHOCARDIOGRAM COMPLETE    Medication Changes: No orders of the defined types were placed in this encounter.   Follow Up: We will follow-up with results of studies  Signed, Candee Furbish, MD  03/17/2020 3:39 PM    [place]

## 2020-03-18 ENCOUNTER — Telehealth: Payer: Self-pay | Admitting: Cardiology

## 2020-03-18 NOTE — Telephone Encounter (Signed)
FYI

## 2020-03-18 NOTE — Telephone Encounter (Signed)
Patient is schedule to have Echo on 03-19-20.   She has decline the Treadmill due to she has to ware a mask while during the test.   She stated she has damaged to her air way since she had a breathing tube removed.

## 2020-03-18 NOTE — Telephone Encounter (Signed)
Thanks for letting me know. During our office visit we discussed coronary CT scan, cardiac angiography, nuclear stress test, exercise treadmill test. She states that she has had issues with IV medications in the past.  We had lengthy discussion about contrast.  She states that she has an allergy to this.  Does not wish to go forward with CT.  When talking about nuclear medicine test, she stated that IV medications do not bode well with her.  We then decided to proceed with exercise treadmill test.  No IVs needed.  Unfortunately, she did not wish to take a Covid swab/test in order to complete this evaluation.  Her echocardiogram will be helpful however if there does happen to be any flow-limiting coronary artery disease present, this will not be assessed necessarily with this study.  She understands and takes full responsibility in her right to decline other testing.  Candee Furbish, MD

## 2020-03-19 ENCOUNTER — Ambulatory Visit (HOSPITAL_COMMUNITY): Payer: 59 | Attending: Cardiology

## 2020-03-19 ENCOUNTER — Other Ambulatory Visit: Payer: Self-pay

## 2020-03-19 DIAGNOSIS — R079 Chest pain, unspecified: Secondary | ICD-10-CM | POA: Insufficient documentation

## 2020-03-19 DIAGNOSIS — R002 Palpitations: Secondary | ICD-10-CM | POA: Diagnosis present

## 2020-03-19 LAB — ECHOCARDIOGRAM COMPLETE
Area-P 1/2: 5.27 cm2
S' Lateral: 2.9 cm

## 2020-03-29 ENCOUNTER — Telehealth: Payer: Self-pay | Admitting: Cardiology

## 2020-03-29 DIAGNOSIS — R0602 Shortness of breath: Secondary | ICD-10-CM

## 2020-03-29 NOTE — Telephone Encounter (Signed)
Let's check echocardiogram. She is concerned because she is having side effects, shortness of breath following a dental procedure.  She is worried that the epinephrine may have damaged her heart.  Hopefully insurance will approve.  Candee Furbish, MD

## 2020-03-29 NOTE — Telephone Encounter (Signed)
Spoke with pt who is reporting she received epi during a dental appt recently which made her heart pound so hard and so fast she feels like she "has been damaged."  She reports she feel winded and sore in her chest afterwards.  She reports now (5 days later) she "don't feel right."  She is asking for her 2D Echo to be repeated (just completed 1/282022) to make sure she hasn't "blown a valve or something."   Advised pt having a rapid HR with epi can be a completely normal side effect.  Pt very argumentive stating she shouldn't been denied having this procedure repeated because she feels she has had an event d/t the epi.  She states she will pay for it out of pocket if necessary but it addiment about having it repeated.  Advised pt that may not be the most appropriate testing needed however I will have Dr Marlou Porch review and call her back with further orders.  Of note - she was ordered to have GXT at refused d/t not wanting to have covid testing.   She now says she will have the GXT but will not unless she has another echo 1st.

## 2020-03-29 NOTE — Telephone Encounter (Signed)
STAT if HR is under 50 or over 120 (normal HR is 60-100 beats per minute)  1) What is your heart rate?  03/29/20: 72        2) Do you have a log of your heart rate readings (document readings)?   03/24/20: 100 03/29/20:72  2) Do you have any other symptoms?  Patient is following up regarding scheduling stress test.  She states initially she reconsidered having the stress test, but now she would like to have an additional echo prior to proceeding with scheduling.  She states on 03/24/20 she went to the dentist and they injected her with Epinephrine.  She assumes they gave her too much because of how she felt afterwards. Per patient, her HR increased and it was racing for the remainder of the day.  Patient states when she left the dentist office her heart felt extremely sore from beating so fast. (not CP). She reports she is feeling much better now, but she worries she may not be able to handle stress test.  She is requesting an order for an echo.

## 2020-03-31 NOTE — Telephone Encounter (Signed)
Pt has been scheduled for echo 2/14

## 2020-04-01 NOTE — Telephone Encounter (Signed)
Called patient back about her message. Patient is requesting a troponin lab draw to see if she had a heart attack after getting Epi from her dentist last week. Informed patient that a message would be sent to Dr. Marlou Porch for the request. Patient stated she had not been able to get on the treadmill since last week and she has had pressure and chest pain since then. Patient did say the chest pain has gone away, but the pressure has not. Patient stated she does not feel like she needs to go to the ED or anything like that, that she just has not felt right since 03/24/20. Will forward to Dr. Marlou Porch for advisement.

## 2020-04-01 NOTE — Telephone Encounter (Signed)
Patient is following up regarding episode on 03/24/20. She states she has been doing a lot of thinking and she now worries that the episode she had may have been a heart attack. She states the amount of Epinephrine she was given was "much more than normal." Patient is requesting an order for an enzyme test to determine whether or not she had a heart attack.

## 2020-04-02 NOTE — Telephone Encounter (Signed)
Ultimately she needs further cardiac evaluation, however she is not able to go forward with cardiac cath, coronary CT, NUC stress, ETT (please see prior note for details on her reasons why she can not proceed with testing).   Troponin is not an appropriate test at this time.   At this time, my recommendation would be to proceed both ETT and coronary calcium score for $99. The ETT will help [place] determine ischemia and calcium score will show if any calcified plaque is present. It does not require an IV or contrast.   Candee Furbish, MD

## 2020-04-05 ENCOUNTER — Other Ambulatory Visit: Payer: Self-pay

## 2020-04-05 ENCOUNTER — Ambulatory Visit (HOSPITAL_COMMUNITY)
Admission: RE | Admit: 2020-04-05 | Discharge: 2020-04-05 | Disposition: A | Discharge status: Home / Self Care | Payer: 59 | Source: Ambulatory Visit | Attending: Cardiology | Admitting: Cardiology

## 2020-04-05 DIAGNOSIS — R0602 Shortness of breath: Secondary | ICD-10-CM | POA: Diagnosis not present

## 2020-04-05 DIAGNOSIS — R0600 Dyspnea, unspecified: Secondary | ICD-10-CM | POA: Insufficient documentation

## 2020-04-05 NOTE — Progress Notes (Signed)
  Echocardiogram 2D Echocardiogram has been performed.  Jennette Dubin 04/05/2020, 8:44 AM

## 2020-04-06 LAB — ECHOCARDIOGRAM COMPLETE
Area-P 1/2: 3.83 cm2
Calc EF: 62.2 %
S' Lateral: 2.8 cm
Single Plane A2C EF: 59.2 %
Single Plane A4C EF: 66 %

## 2020-04-06 NOTE — Telephone Encounter (Signed)
Normal left ventricular pump function. No evidence of heart damage. Very reassuring. No change from prior echo.  Candee Furbish, MD     Per Dr Marlou Porch - no change - no further orders at this time.

## 2020-04-07 ENCOUNTER — Telehealth: Payer: Self-pay | Admitting: Cardiology

## 2020-04-07 NOTE — Telephone Encounter (Signed)
Spoke with pt who reports she continues to have intermittent episodes of heart racing and SOB since receiving "overdose of Epi" at her dental office at the beginning of February.  She reports having an episode this am that has currently resolved.  HR was 100+ , palpitations and SOB.  Resolution after lying down.  Pt also has concerns with her last echo as she see a difference between the 2 results.  Pt states the second echo mentions mild aortic valve sclerosis is present.  Pt is afraid this was cause by the reaction to the Epi and her heart being overworked.  Offered patient reassurance and advised will forward information and concerns to Dr Marlou Porch for review and further recommendation.  Pt verbalizes understanding and agrees with current plan.

## 2020-04-07 NOTE — Telephone Encounter (Signed)
Pt called back c/o SOB again (although did not notice this on phone w/ pt). Aware we are awaiting Dr. Marlou Porch advisement on monitor length.  She is quick to point out that she is extremely sensitive and "bled" last time she wore one.  Tried to assure pt what "mild aortic sclerosis" means and that we would doing nothing at this time but continue monitoring it.   She then asks about getting him to see him so that he can listen to her neck with a stethoscope to ensure she is doing ok.  Pt reminded that his nurse Pam had already arranged for pt to see him next week, Monday, to further discuss.   Patient verbalized understanding and agreeable to plan.

## 2020-04-07 NOTE — Telephone Encounter (Signed)
   Pt is returning call for her echo result  Pt c/o Shortness Of Breath: STAT if SOB developed within the last 24 hours or pt is noticeably SOB on the phone  1. Are you currently SOB (can you hear that pt is SOB on the phone)? No  2. How long have you been experiencing SOB? After she went to the dentist and had an episode today 10 mins ago  3. Are you SOB when sitting or when up moving around?   4. Are you currently experiencing any other symptoms? Pt said since she went to the dentist she got over dosed of epinephrine and since then she's been having palpitations and SOB. She said she had an episode this morning, and her HR was over a 100. She lay down and her HR went down to 79. Pt also wanted to discuss her echo result

## 2020-04-07 NOTE — Telephone Encounter (Signed)
Aortic sclerosis is mild calcification of the aortic valve which can occur after several years of age process. We see it very commonly.  There is no sign of damage to the heart from this echocardiogram.   Order a ZIO patch monitor for her to monitor her heart rate Order coronary calcium score if not already done.   Thanks   Candee Furbish, MD

## 2020-04-08 NOTE — Telephone Encounter (Signed)
Thanks for the update. Will see her soon. Appreciate it Candee Furbish, MD

## 2020-04-12 ENCOUNTER — Encounter: Payer: Self-pay | Admitting: Cardiology

## 2020-04-12 ENCOUNTER — Ambulatory Visit: Payer: 59 | Admitting: Cardiology

## 2020-04-12 ENCOUNTER — Other Ambulatory Visit: Payer: Self-pay

## 2020-04-12 VITALS — BP 110/60 | HR 79 | Ht 66.0 in | Wt 153.0 lb

## 2020-04-12 DIAGNOSIS — R079 Chest pain, unspecified: Secondary | ICD-10-CM | POA: Diagnosis not present

## 2020-04-12 DIAGNOSIS — R002 Palpitations: Secondary | ICD-10-CM | POA: Diagnosis not present

## 2020-04-12 NOTE — Patient Instructions (Signed)
Medication Instructions: Your physician recommends that you continue on your current medications as directed. Please refer to the Current Medication list given to you today.  Your physician recommends that you continue on your current medications as directed. Please refer to the Current Medication list given to you today.  *If you need a refill on your cardiac medications before your next appointment, please call your pharmacy*   Lab Work: None ordered  If you have labs (blood work) drawn today and your tests are completely normal, you will receive your results only by: [facility] MyChart Message (if you have MyChart) OR . A paper copy in the mail If you have any lab test that is abnormal or we need to change your treatment, we will call you to review the results.   Testing/Procedures: None ordered.    Follow-Up: At [facility], you and your health needs are our priority.  As part of our continuing mission to provide you with exceptional heart care, we have created designated Provider Care Teams.  These Care Teams include your primary Cardiologist (physician) and Advanced Practice Providers (APPs -  Physician Assistants and Nurse Practitioners) who all work together to provide you with the care you need, when you need it.  We recommend signing up for the patient portal called "MyChart".  Sign up information is provided on this After Visit Summary.  MyChart is used to connect with patients for Virtual Visits (Telemedicine).  Patients are able to view lab/test results, encounter notes, upcoming appointments, etc.  Non-urgent messages can be sent to your provider as well.   To learn more about what you can do with MyChart, go to [email or web address].    Your next appointment:   6 month(s)  The format for your next appointment:   In Person  Provider:   Dr Marlou Porch

## 2020-04-12 NOTE — Progress Notes (Addendum)
Cardiology Office Note:    Date:  04/12/2020   ID:  Felicia Bryant, DOB 1955-09-15, MRN [id number]  PCP:  Sharilyn Sites, [place]  Cardiologist:  No primary care provider on file.  Advanced Practice Provider:  No care team member to display Electrophysiologist:  None       Referring MD: Sharilyn Sites, MD    History of Present Illness:    Felicia Bryant is a 65 y.o. female here for the follow-up discussion of aortic sclerosis noted on echocardiogram as well as recent chest pain/racing heartbeat/palpitations episode following epinephrine utilized for dental procedure.  Emergency department on 02/12/2020 with precordial pain.   Past Medical History:  Diagnosis Date   Asthma    Complication of anesthesia    pt states i have had trouble with my throat closing after i had a tube down my throat before."   Palpitations    seen Dr.Mihir Flanigan in 2009..holter with pac's   Palpitations    echocardiogram in 4098 with diastolic dysfunction EF 57 %   Seizures ([place])    had seizure as child from vaccines and has had not seizures since was a child'.    Past Surgical History:  Procedure Laterality Date   CATARACT EXTRACTION W/PHACO Right 11/30/2014   Procedure: CATARACT EXTRACTION PHACO AND INTRAOCULAR LENS PLACEMENT (IOC);  Surgeon: Tonny Branch, MD;  Location: [facility];  Service: Ophthalmology;  Laterality: Right;  CDE:10.59   CATARACT EXTRACTION W/PHACO Left 03/04/2018   Procedure: CATARACT EXTRACTION PHACO AND INTRAOCULAR LENS PLACEMENT (IOC);  Surgeon: Tonny Branch, MD;  Location: [facility];  Service: Ophthalmology;  Laterality: Left;  CDE: 8.40   CHOLECYSTECTOMY     conization of cervix     DILATION AND CURETTAGE OF UTERUS     episiotomy repair also.   TONSILLECTOMY      Current Medications: Current Meds  Medication Sig   albuterol (VENTOLIN HFA) 108 (90 Base) MCG/ACT inhaler Inhale 1-2 puffs into the lungs as needed for wheezing or shortness of breath.    Cholecalciferol (VITAMIN D PO) Take 1,000 Units by mouth daily.   Methylcobalamin 1000 MCG SUBL Place 1,000 mcg under the tongue 4 (four) times a week.   OVER THE COUNTER MEDICATION Take 14 drops by mouth daily. Algae based Omega 3     Allergies:   Benadryl [diphenhydramine hcl (sleep)], Hemophilus b polysaccharide vaccine, Promethazine, Vitamin d analogs, Ciprofloxacin, Codeine, Depo-medrol [methylprednisolone sodium succ], Fluzone [influenza virus vaccine], Iodinated diagnostic agents, Iodine, Levaquin [levofloxacin], Macrobid [nitrofurantoin macrocrystal], Percocet [oxycodone-acetaminophen], Amoxicillin, Bactrim [sulfamethoxazole-trimethoprim], and Keflex [cephalexin]   Social History   Socioeconomic History   Marital status: Married    Spouse name: Not on file   Number of children: Not on file   Years of education: Not on file   Highest education level: Not on file  Occupational History   Not on file  Tobacco Use   Smoking status: Never Smoker   Smokeless tobacco: Never Used  Vaping Use   Vaping Use: Never used  Substance and Sexual Activity   Alcohol use: No   Drug use: No   Sexual activity: Yes    Birth control/protection: None, Post-menopausal  Other Topics Concern   Not on file  Social History Narrative   Not on file   Social Determinants of Health   Financial Resource Strain: Not on file  Food Insecurity: Not on file  Transportation Needs: Not on file  Physical Activity: Not on file  Stress: Not on  file  Social Connections: Not on file     Family History: The patient's family history includes Dementia in her mother; Heart attack in her father. There is no history of Stroke.  ROS:   Please see the history of present illness.     All other systems reviewed and are negative.  EKGs/Labs/Other Studies Reviewed:    The following studies were reviewed today: Prior echocardiograms were reviewed.   Recent Labs: 02/12/2020: BUN 9; Creatinine,  Ser 0.70; Hemoglobin 13.2; Platelets 269; Potassium 3.7; Sodium 138  Recent Lipid Panel No results found for: CHOL, TRIG, HDL, CHOLHDL, VLDL, LDLCALC, LDLDIRECT   Risk Assessment/Calculations:      Physical Exam:    VS:  BP 110/60 (BP Location: Left Arm, Patient Position: Sitting, Cuff Size: Normal)    Pulse 79    Ht 5\' 6"  (1.676 m)    Wt 153 lb (69.4 kg)    SpO2 99%    BMI 24.69 kg/m     Wt Readings from Last 3 Encounters:  04/12/20 153 lb (69.4 kg)  03/17/20 154 lb (69.9 kg)  02/12/20 153 lb (69.4 kg)     GEN:  Well nourished, well developed in no acute distress HEENT: Normal NECK: No JVD; No carotid bruits LYMPHATICS: No lymphadenopathy CARDIAC: RRR, no murmurs, rubs, gallops RESPIRATORY:  Clear to auscultation without rales, wheezing or rhonchi  ABDOMEN: Soft, non-tender, non-distended MUSCULOSKELETAL:  No edema; No deformity  SKIN: Warm and dry NEUROLOGIC:  Alert and oriented x 3 PSYCHIATRIC:  Normal affect   ASSESSMENT:    No diagnosis found. PLAN:    In order of problems listed above:  Chest pain/palpitations -Reassurance has been given.  Her heart is strong.  Echocardiogram demonstrates normal pump function.  No signs of any damage. -I would be comfortable with her receiving epinephrine again for dental procedures if applicable. -Since her palpitations are improving, she may go ahead and continue with diet, exercise.  No need for ZIO monitor. -She will also hold off at this time on treadmill test.  Did not want to run with a mask in place. -She did remind me that she had trouble in the past receiving contrast.  Took her 2 weeks (correction) to get over this.  This was in anticipation of potential need for contrast in the future. Took 2 months to survive steroids. She sent me a message following this appt with the following information: Just a small correction needed to Dr. Marlou Porch notes. In the "notes" section under "plan" it says that it took me 2 weeks to get  over the bad reaction I had with a steroid injection. Correction: It took me 2 weeks to get over the bad  reaction I had to the "contrast" and it took me 2 months of fighting for my life to get over the steroid injection, which I later learned was given with a flu shot.  Thank you. :-)  -We will also hold off on coronary calcium score at this time.   Medication Adjustments/Labs and Tests Ordered: Current medicines are reviewed at length with the patient today.  Concerns regarding medicines are outlined above.  No orders of the defined types were placed in this encounter.  No orders of the defined types were placed in this encounter.   Patient Instructions  Medication Instructions: Your physician recommends that you continue on your current medications as directed. Please refer to the Current Medication list given to you today.  Your physician recommends that you continue on your  current medications as directed. Please refer to the Current Medication list given to you today.  *If you need a refill on your cardiac medications before your next appointment, please call your pharmacy*   Lab Work: None ordered  If you have labs (blood work) drawn today and your tests are completely normal, you will receive your results only by:  [facility] (if you have MyChart) OR  A paper copy in the mail If you have any lab test that is abnormal or we need to change your treatment, we will call you to review the results.   Testing/Procedures: None ordered.    Follow-Up: At [facility], you and your health needs are our priority.  As part of our continuing mission to provide you with exceptional heart care, we have created designated Provider Care Teams.  These Care Teams include your primary Cardiologist (physician) and Advanced Practice Providers (APPs -  Physician Assistants and Nurse Practitioners) who all work together to provide you with the care you need, when you need it.  We  recommend signing up for the patient portal called "MyChart".  Sign up information is provided on this After Visit Summary.  MyChart is used to connect with patients for Virtual Visits (Telemedicine).  Patients are able to view lab/test results, encounter notes, upcoming appointments, etc.  Non-urgent messages can be sent to your provider as well.   To learn more about what you can do with MyChart, go to [email or web address].    Your next appointment:   6 month(s)  The format for your next appointment:   In Person  Provider:   Dr Marlou Porch        Signed, Candee Furbish, MD  04/12/2020 2:23 PM    [place]

## 2020-08-28 IMAGING — CT CT ABD-PELV W/O CM
2 of 4 series · 16 of 46 positions shown, 18 images · non-contrast
Comparison: None.

CLINICAL DATA: RIGHT abdominal pain for 3 days. Uterine dilatation
and curettage 2 weeks ago.

EXAM:
CT ABDOMEN AND PELVIS WITHOUT CONTRAST
TECHNIQUE: Multidetector CT imaging of the abdomen and pelvis was performed
following the standard protocol without IV contrast.

[Series 3: ap without · axial · non-contrast · 0.64mm/px · z∈[-969,-569]mm · 13 of 92 slices shown, 15 images]
[im 6/92  soft-tissue]
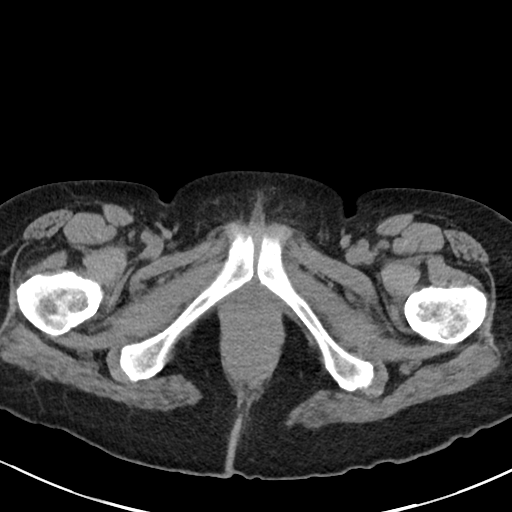
[im 6/92  bone]
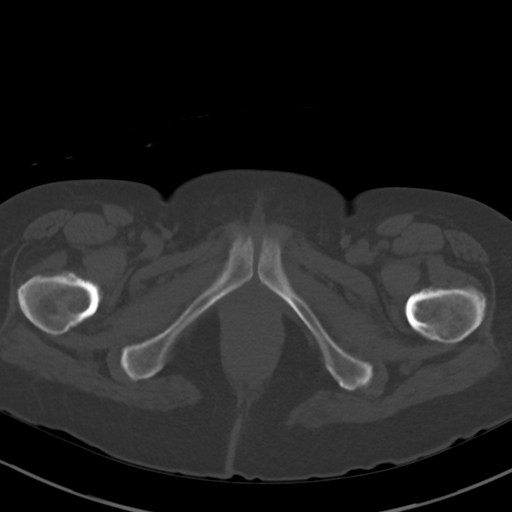
[im 11/92  soft-tissue]
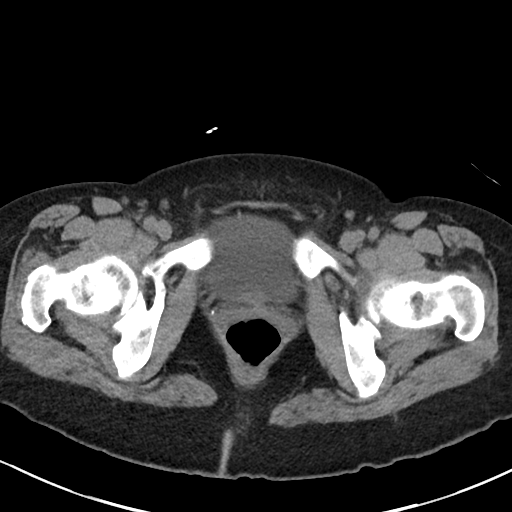
[im 21/92  soft-tissue]
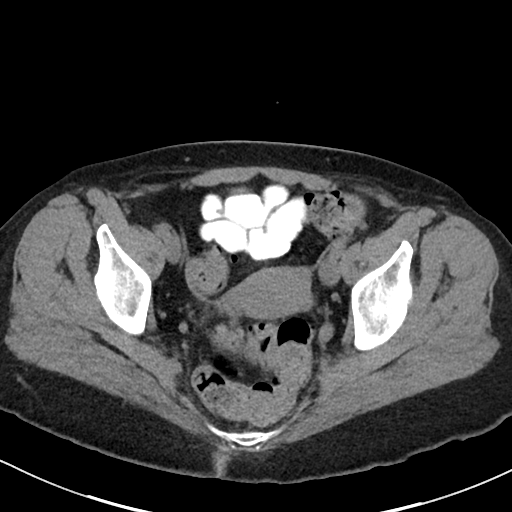
[im 26/92  soft-tissue]
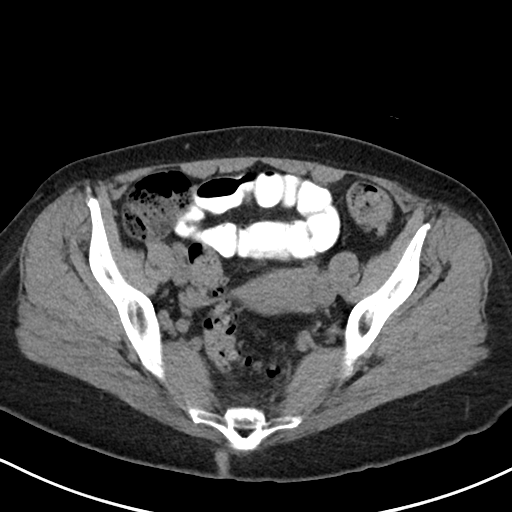
[im 31/92  soft-tissue]
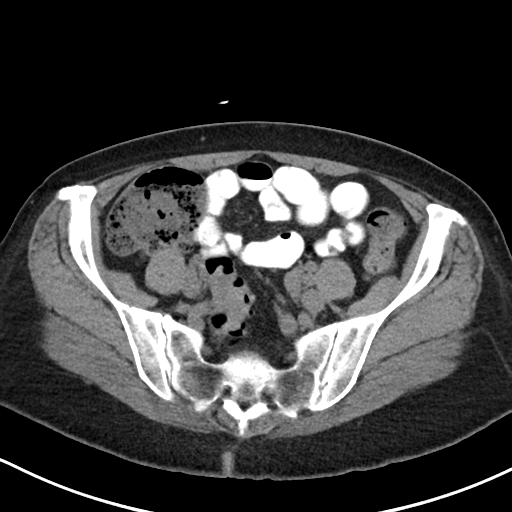
[im 41/92  soft-tissue]
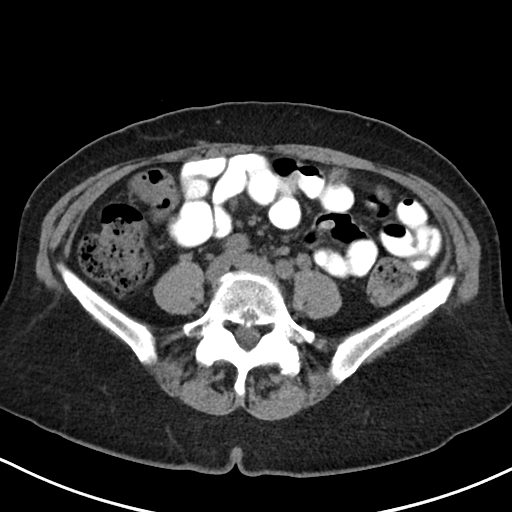
[im 46/92  soft-tissue]
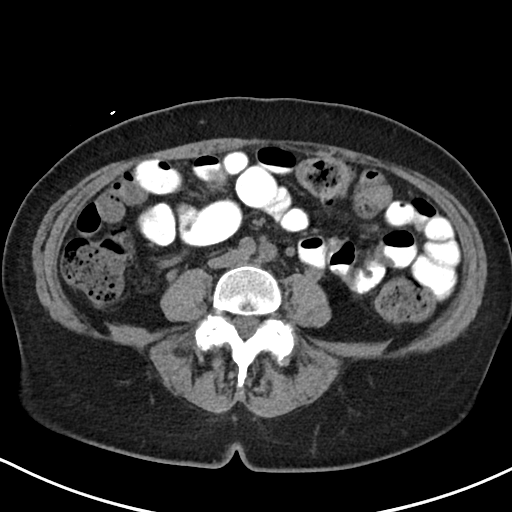
[im 51/92  soft-tissue]
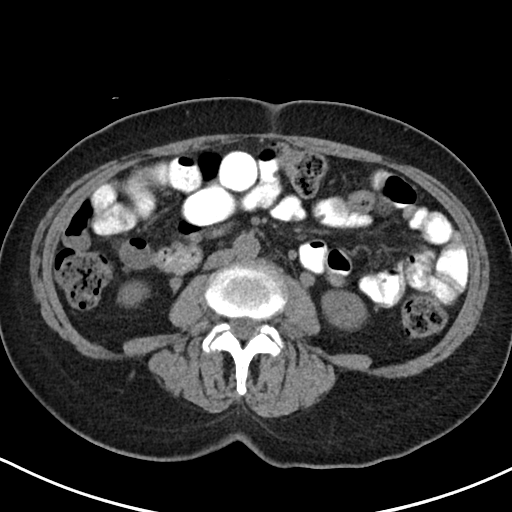
[im 61/92  soft-tissue]
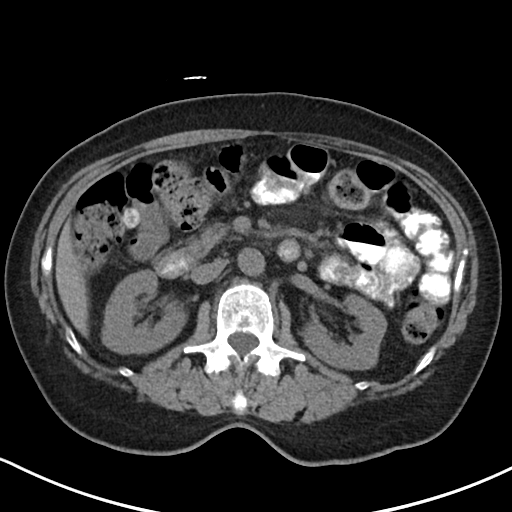
[im 61/92  bone]
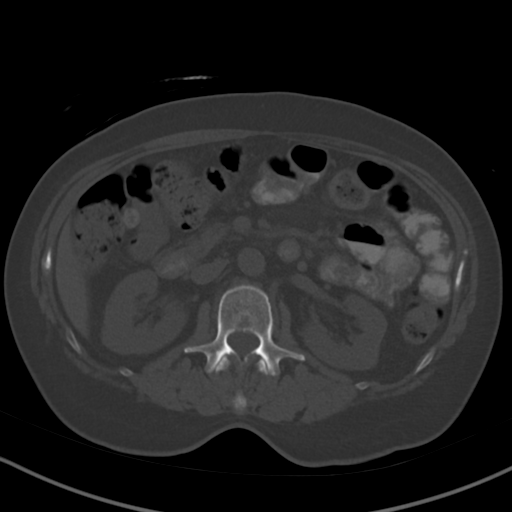
[im 66/92  soft-tissue]
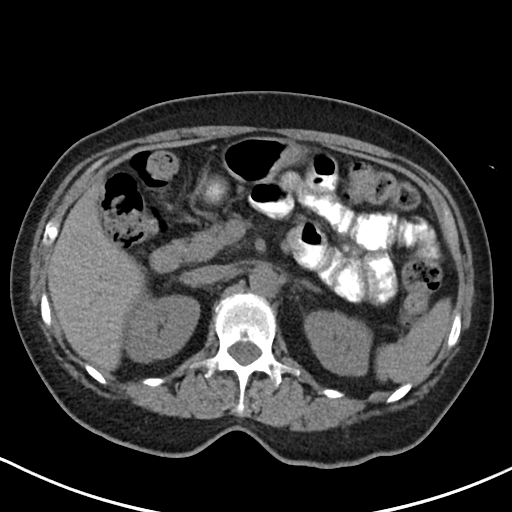
[im 71/92  soft-tissue]
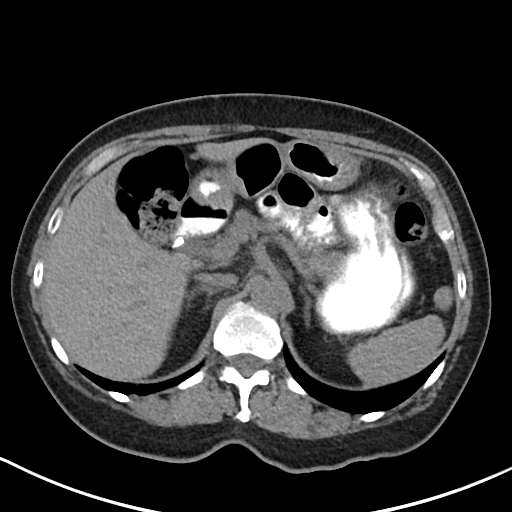
[im 81/92  soft-tissue]
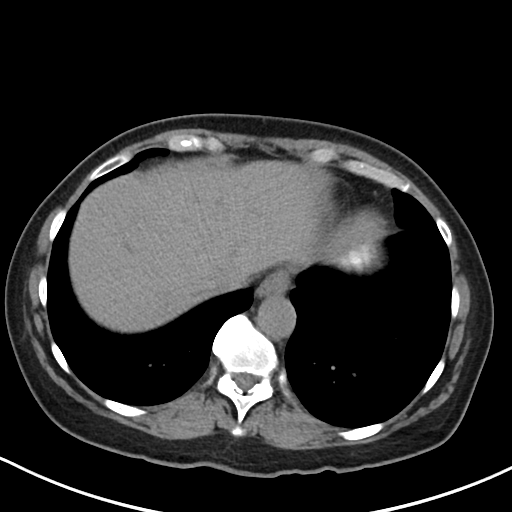
[im 86/92  soft-tissue]
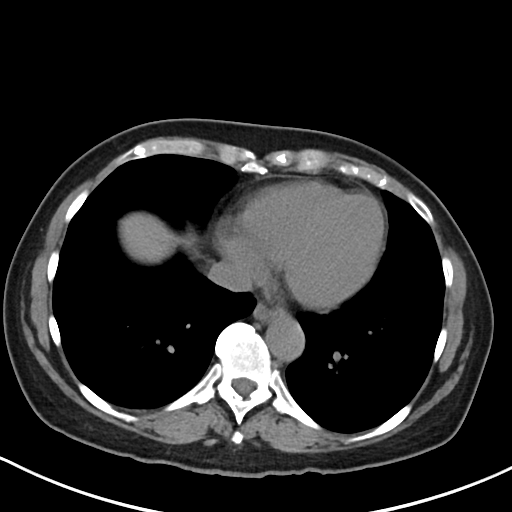

[Series 6: cor · coronal · 0.65mm/px · 3 of 76 slices shown]
[im 26/76  soft-tissue]
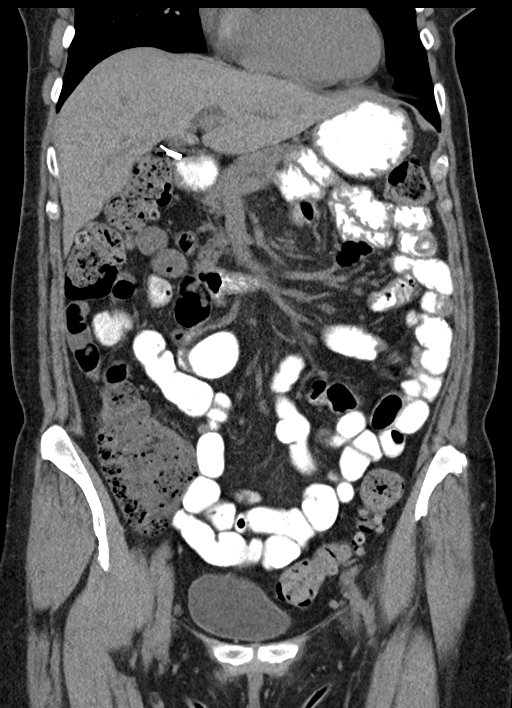
[im 34/76  soft-tissue]
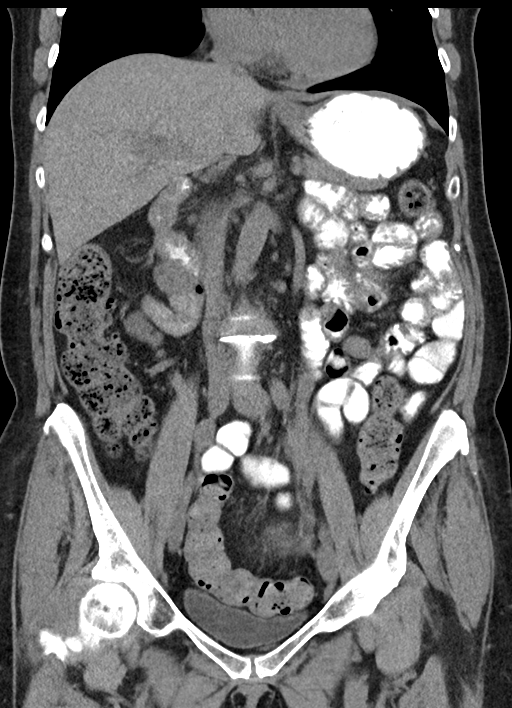
[im 42/76  soft-tissue]
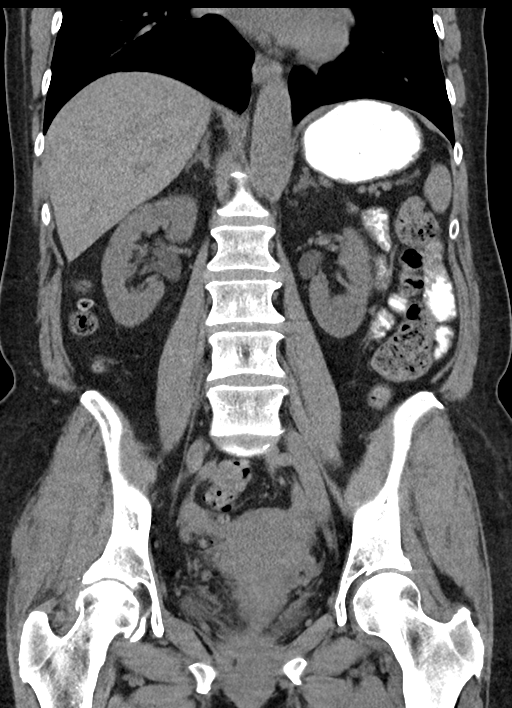

[16 of 46 positions shown; findings below may reference images not displayed]

FINDINGS: LOWER CHEST: Lung bases are clear. The visualized heart size is
normal. No pericardial effusion.

HEPATOBILIARY: Status post cholecystectomy. Normal non-contrast CT
liver.

PANCREAS: Normal.

SPLEEN: Normal.

ADRENALS/URINARY TRACT: Kidneys are orthotopic, demonstrating normal
size and morphology. No nephrolithiasis, hydronephrosis; limited
assessment for renal masses by nonenhanced CT. The unopacified
ureters are normal in course and caliber. Urinary bladder is
partially distended and unremarkable. Normal adrenal glands.

STOMACH/BOWEL: The stomach, small and large bowel are normal in
course and caliber without inflammatory changes, sensitivity
decreased by lack of enteric contrast. Moderate volume retained
large bowel stool. Moderate descending and sigmoid with a few
additional colonic diverticulosis scattered diverticula. Normal
appendix.

VASCULAR/LYMPHATIC: Aortoiliac vessels are normal in course and
caliber. No lymphadenopathy by CT size criteria.

REPRODUCTIVE: Normal.

OTHER: No intraperitoneal free fluid or free air.

MUSCULOSKELETAL: Non-acute. Moderate RIGHT hip osteoarthrosis.
Advanced lower lumbar facet arthropathy.
IMPRESSION: 1. Colonic diverticulosis without acute diverticulitis nor acute
intra-abdominal/pelvic process.
2. Moderate volume retained large bowel stool.

## 2020-11-02 ENCOUNTER — Telehealth: Payer: Self-pay | Admitting: Cardiology

## 2020-11-02 NOTE — Telephone Encounter (Signed)
Jerline Pain, MD  You; Thora Lance, RN 2 hours ago (10:57 AM)   Agree. HR OK now.  Candee Furbish, MD

## 2020-11-02 NOTE — Telephone Encounter (Signed)
Spoke with pt who reports she developed sore throat and congestion yesterday with negative Covid test.  Pt states she began coughing last night d/t thick phlegm which in turn caused her "vocal chords to spasm" x 2.  She states this caused her HR to elevate.  Pt then used her albuterol and HR increased to 122.  Since that time HR has been running between 80's and 105.  She is concerned d/t the tachycardia.   Pt denies current CP, SOB, dizziness/fainting or fever.  Pt reports current HR is 92 and normal HR for her is in the 70's. Recommended pt contact her PCP to discuss current symptoms of sore throat, congestion and cough.  Education provided re: Albuterol increasing HR and HR of 60-100 considered WNL.  Monitor HR as needed.  Reviewed ED precautions.  Will forward to Dr Marlou Porch for review. Pt verbalizes understanding and agrees with current plan.

## 2020-11-02 NOTE — Telephone Encounter (Signed)
STAT if HR is under 50 or over 120 (normal HR is 60-100 beats per minute)  What is your heart rate? 109 now laying down  Do you have a log of your heart rate readings (document readings)? 86, 105, 122  Do you have any other symptoms? Patient states she has a cold    Patient states she has a cold and coughed  to clear the phlegm, but it closed her throat and she could not breathe for 3-5 seconds. She says this happened twice and the adrenaline triggered her heart to start racing. She says her HR went up to 122, but just now it was 109 laying down. She says her normal HR is 72. She says she took albuterol, but is not sure if she took too much. She also says she used her breathing machine to break up the mucus. She says last night she was very careful not to cough hard, because the fluid was tight and she was concerned her throat would close up again.

## 2020-11-10 ENCOUNTER — Encounter: Payer: Self-pay | Admitting: Emergency Medicine

## 2020-11-10 ENCOUNTER — Other Ambulatory Visit: Payer: Self-pay

## 2020-11-10 ENCOUNTER — Ambulatory Visit
Admission: EM | Admit: 2020-11-10 | Discharge: 2020-11-10 | Disposition: A | Discharge status: Home / Self Care | Payer: 59

## 2020-11-10 DIAGNOSIS — J069 Acute upper respiratory infection, unspecified: Secondary | ICD-10-CM

## 2020-11-10 NOTE — Discharge Instructions (Addendum)
Use your nebulizer every 4-6 hours.  See your Md for recheck next week if not improving

## 2020-11-10 NOTE — ED Triage Notes (Signed)
Cough, sore throat, chest congestion and fatigue x 9 days.   Has been using neb machine with some relief.

## 2020-11-17 NOTE — ED Provider Notes (Signed)
[facility]    CSN: [id number] Arrival date & time: 11/10/20  1322      History   Chief Complaint No chief complaint on file.   HPI Felicia Bryant is a 65 y.o. female.   The history is provided by the patient. No language interpreter was used.  Cough Cough characteristics:  Non-productive Sputum characteristics:  Nondescript Severity:  Moderate Onset quality:  Gradual Duration:  9 days Timing:  Constant Relieved by:  Nothing Worsened by:  Nothing Ineffective treatments:  None tried  Past Medical History:  Diagnosis Date   Asthma    Complication of anesthesia    pt states i have had trouble with my throat closing after i had a tube down my throat before."   Palpitations    seen Dr.Skains in 2009..holter with pac's   Palpitations    echocardiogram in 0867 with diastolic dysfunction EF 57 %   Seizures ([place])    had seizure as child from vaccines and has had not seizures since was a child'.    Patient Active Problem List   Diagnosis Date Noted   Palpitations 05/24/2017    Past Surgical History:  Procedure Laterality Date   CATARACT EXTRACTION W/PHACO Right 11/30/2014   Procedure: CATARACT EXTRACTION PHACO AND INTRAOCULAR LENS PLACEMENT ([place]);  Surgeon: Tonny Branch, MD;  Location: [facility];  Service: Ophthalmology;  Laterality: Right;  CDE:10.59   CATARACT EXTRACTION W/PHACO Left 03/04/2018   Procedure: CATARACT EXTRACTION PHACO AND INTRAOCULAR LENS PLACEMENT ([facility]);  Surgeon: Tonny Branch, MD;  Location: [facility];  Service: Ophthalmology;  Laterality: Left;  CDE: 8.40   CHOLECYSTECTOMY     conization of cervix     DILATION AND CURETTAGE OF UTERUS     episiotomy repair also.   TONSILLECTOMY      OB History   No obstetric history on file.      Home Medications    Prior to Admission medications   Medication Sig Start Date End Date Taking? Authorizing Provider  albuterol (VENTOLIN HFA) 108 (90 Base) MCG/ACT inhaler Inhale 1-2 puffs into the lungs as needed  for wheezing or shortness of breath. 11/13/19   [provider]  Cholecalciferol (VITAMIN D PO) Take 1,000 Units by mouth daily.    [provider]  Methylcobalamin 1000 MCG SUBL Place 1,000 mcg under the tongue 4 (four) times a week.    [provider]  OVER THE COUNTER MEDICATION Take 14 drops by mouth daily. Algae based Omega 3    [provider]    Family History Family History  Problem Relation Age of Onset   Dementia Mother    Heart attack Father    Stroke Neg Hx     Social History Social History   Tobacco Use   Smoking status: Never   Smokeless tobacco: Never  Vaping Use   Vaping Use: Never used  Substance Use Topics   Alcohol use: No   Drug use: No     Allergies   Benadryl [diphenhydramine hcl (sleep)], Hemophilus b polysaccharide vaccine, Promethazine, Vitamin d analogs, Ciprofloxacin, Codeine, Depo-medrol [methylprednisolone sodium succ], Fluzone [influenza virus vaccine], Iodinated diagnostic agents, Iodine, Levaquin [levofloxacin], Macrobid [nitrofurantoin macrocrystal], Percocet [oxycodone-acetaminophen], Amoxicillin, Bactrim [sulfamethoxazole-trimethoprim], Keflex [cephalexin], and Metrogel [metronidazole]   Review of Systems Review of Systems  Respiratory:  Positive for cough.   All other systems reviewed and are negative.   Physical Exam Triage Vital Signs ED Triage Vitals  Enc Vitals Group     BP 11/10/20 1357 132/74  Pulse Rate 11/10/20 1357 79     Resp 11/10/20 1357 18     Temp 11/10/20 1357 98.5 F (36.9 C)     Temp Source 11/10/20 1357 Oral     SpO2 11/10/20 1357 99 %     Weight --      Height --      Head Circumference --      Peak Flow --      Pain Score 11/10/20 1358 0     Pain Loc --      Pain Edu? --      Excl. in [place]? --    No data found.  Updated Vital Signs BP 132/74 (BP Location: Left Arm)   Pulse 79   Temp 98.5 F (36.9 C) (Oral)   Resp 18   SpO2 99%   Visual Acuity Right Eye  Distance:   Left Eye Distance:   Bilateral Distance:    Right Eye Near:   Left Eye Near:    Bilateral Near:     Physical Exam Vitals and nursing note reviewed.  Constitutional:      Appearance: She is well-developed.  HENT:     Head: Normocephalic.  Pulmonary:     Effort: Pulmonary effort is normal.  Abdominal:     General: There is no distension.  Musculoskeletal:        General: Normal range of motion.     Cervical back: Normal range of motion.  Neurological:     Mental Status: She is alert and oriented to person, place, and time.     UC Treatments / Results  Labs (all labs ordered are listed, but only abnormal results are displayed) Labs Reviewed - No data to display  EKG   Radiology No results found.  Procedures Procedures (including critical care time)  Medications Ordered in UC Medications - No data to display  Initial Impression / Assessment and Plan / UC Course  I have reviewed the triage vital signs and the nursing notes.  Pertinent labs & imaging results that were available during my care of the patient were reviewed by me and considered in my medical decision making (see chart for details).      Final Clinical Impressions(s) / UC Diagnoses   Final diagnoses:  Acute upper respiratory infection     Discharge Instructions      Use your nebulizer every 4-6 hours.  See your Md for recheck next week if not improving   ED Prescriptions   None    PDMP not reviewed this encounter. An After Visit Summary was printed and given to the patient.    Fransico Meadow, [place] 11/17/20 1218

## 2021-11-13 ENCOUNTER — Encounter (HOSPITAL_COMMUNITY): Payer: Self-pay | Admitting: Emergency Medicine

## 2021-11-13 ENCOUNTER — Emergency Department (HOSPITAL_COMMUNITY): Payer: Medicare Other

## 2021-11-13 ENCOUNTER — Emergency Department (HOSPITAL_COMMUNITY)
Admission: EM | Admit: 2021-11-13 | Discharge: 2021-11-14 | Discharge status: Against Medical Advice | Payer: Medicare Other | Source: Home / Self Care

## 2021-11-13 ENCOUNTER — Other Ambulatory Visit: Payer: Self-pay

## 2021-11-13 ENCOUNTER — Emergency Department (HOSPITAL_COMMUNITY)
Admission: EM | Admit: 2021-11-13 | Discharge: 2021-11-13 | Disposition: A | Discharge status: Home / Self Care | Payer: Medicare Other | Attending: Emergency Medicine | Admitting: Emergency Medicine

## 2021-11-13 DIAGNOSIS — Z5321 Procedure and treatment not carried out due to patient leaving prior to being seen by health care provider: Secondary | ICD-10-CM | POA: Insufficient documentation

## 2021-11-13 DIAGNOSIS — R002 Palpitations: Secondary | ICD-10-CM | POA: Insufficient documentation

## 2021-11-13 DIAGNOSIS — J45909 Unspecified asthma, uncomplicated: Secondary | ICD-10-CM | POA: Insufficient documentation

## 2021-11-13 DIAGNOSIS — R064 Hyperventilation: Secondary | ICD-10-CM | POA: Insufficient documentation

## 2021-11-13 DIAGNOSIS — R0602 Shortness of breath: Secondary | ICD-10-CM | POA: Insufficient documentation

## 2021-11-13 LAB — URINALYSIS, ROUTINE W REFLEX MICROSCOPIC
Bacteria, UA: NONE SEEN
Bilirubin Urine: NEGATIVE
Glucose, UA: NEGATIVE mg/dL
Ketones, ur: NEGATIVE mg/dL
Leukocytes,Ua: NEGATIVE
Nitrite: NEGATIVE
Protein, ur: NEGATIVE mg/dL
Specific Gravity, Urine: 1.004 — ABNORMAL LOW (ref 1.005–1.030)
pH: 7 (ref 5.0–8.0)

## 2021-11-13 LAB — COMPREHENSIVE METABOLIC PANEL
ALT: 12 U/L (ref 0–44)
AST: 20 U/L (ref 15–41)
Albumin: 4.3 g/dL (ref 3.5–5.0)
Alkaline Phosphatase: 80 U/L (ref 38–126)
Anion gap: 11 (ref 5–15)
BUN: 12 mg/dL (ref 8–23)
CO2: 22 mmol/L (ref 22–32)
Calcium: 9.7 mg/dL (ref 8.9–10.3)
Chloride: 105 mmol/L (ref 98–111)
Creatinine, Ser: 0.71 mg/dL (ref 0.44–1.00)
GFR, Estimated: >60 mL/min (ref >60)
Glucose, Bld: 126 mg/dL — ABNORMAL HIGH (ref 70–99)
Potassium: 3.7 mmol/L (ref 3.5–5.1)
Sodium: 138 mmol/L (ref 135–145)
Total Bilirubin: 0.6 mg/dL (ref 0.3–1.2)
Total Protein: 7.3 g/dL (ref 6.5–8.1)

## 2021-11-13 LAB — CBC WITH DIFFERENTIAL/PLATELET
Abs Immature Granulocytes: 0.01 10*3/uL (ref 0.00–0.07)
Basophils Absolute: 0 10*3/uL (ref 0.0–0.1)
Basophils Relative: 1 %
Eosinophils Absolute: 0 10*3/uL (ref 0.0–0.5)
Eosinophils Relative: 1 %
HCT: 38.4 % (ref 36.0–46.0)
Hemoglobin: 12.9 g/dL (ref 12.0–15.0)
Immature Granulocytes: 0 %
Lymphocytes Relative: 32 %
Lymphs Abs: 1.7 10*3/uL (ref 0.7–4.0)
MCH: 31.2 pg (ref 26.0–34.0)
MCHC: 33.6 g/dL (ref 30.0–36.0)
MCV: 92.8 fL (ref 80.0–100.0)
Monocytes Absolute: 0.5 10*3/uL (ref 0.1–1.0)
Monocytes Relative: 9 %
Neutro Abs: 3 10*3/uL (ref 1.7–7.7)
Neutrophils Relative %: 57 %
Platelets: 257 10*3/uL (ref 150–400)
RBC: 4.14 MIL/uL (ref 3.87–5.11)
RDW: 11.9 % (ref 11.5–15.5)
WBC: 5.3 10*3/uL (ref 4.0–10.5)
nRBC: 0 % (ref 0.0–0.2)

## 2021-11-13 NOTE — Discharge Instructions (Signed)
You have been evaluated for your symptoms.  Fortunately your EKG, labs, and chest x-ray did not show any concerning finding.  Trace of blood were noted in your urine and you may follow-up with your doctor for reassessment.  Your lungs are clear today.  If you notice any significant worsening shortness of breath with an oxygen less than 95% on a home pulse oximeter, consider return to the ER for further assessment.

## 2021-11-13 NOTE — ED Provider Triage Note (Signed)
Emergency Medicine Provider Triage Evaluation Note  Felicia Bryant , a 66 y.o. female  was evaluated in triage.  Pt complains of episodes of hyperventilation followed by pressure in the chest and palpitations.  Was seen in ER earlier in the day with a reassuring work-up and consideration of contribution of her anxiety to her presentation.  Returns this evening with recurrent episode symptoms not resolved.  Patient reports history of anaphylactic reaction both to iodinated contrast and premedication.  Review of Systems  Positive: Shortness of breath, palpitations, chest pressure Negative: Chest pain nausea vomiting diarrhea, syncope  Physical Exam  BP 134/76 (BP Location: Left Arm)   Pulse 72   Temp 97.8 F (36.6 C) (Oral)   Resp 16   SpO2 100%  Gen:   Awake, no distress   Resp:  Normal effort  MSK:   Moves extremities without difficulty  Other:  RRR no M/R/G.  Lungs CTA B  Medical Decision Making  Medically screening exam initiated at 10:28 PM.  Appropriate orders placed.  [name] was informed that the remainder of the evaluation will be completed by another provider, this initial triage assessment does not replace that evaluation, and the importance of remaining in the ED until their evaluation is complete.  Workup initiated, clinical picture less concerning for PE, dimer considered but not ordered at this time. This chart was dictated using voice recognition software, Dragon. Despite the best efforts of this provider to proofread and correct errors, errors may still occur which can change documentation meaning.    [name], PA-C 11/13/21 2242

## 2021-11-13 NOTE — ED Triage Notes (Signed)
Pt reports she was having another episode of rapid breathing, she was seen for the same earlier today and states she does not feel it is stress.  She is breathing normal in triage, symptoms since resolved.

## 2021-11-13 NOTE — ED Triage Notes (Signed)
Patient here with [facility] for about a month. Patient noticed when she eats a big meal she cant take a deep breath. Patient also notices on exertion she becomes very [facility] and is having more frequent asthma attacks. Patient denies chest pain, or abdominal pain, n/v/d, fevers chills.

## 2021-11-13 NOTE — ED Provider Notes (Signed)
[facility] Provider Note   CSN: [id number] Arrival date & time: 11/13/21  1053     History  Chief Complaint  Patient presents with   Shortness of Breath    Felicia Bryant is a 66 y.o. female.  The history is provided by the patient and medical records. No language interpreter was used.  Shortness of Breath    66 year old female significant history of asthma, heart palpitation who presents with complaints of shortness of breath.  Patient report for the past month she has had intermittent episodes of shortness of breath.  She described as unable to take a deep breath sometimes brought on by eating a heavy meal and sometimes when she feels anxious.  Shortness of breath is not present when she walks on the treadmill or exerts herself.  There are no associated productive cough, fever, runny nose sneezing sore throat no chest pain and no wheezing.  No report of nausea vomiting diarrhea.  Denies any history of PE or DVT and denies any leg swelling or calf pain.  She does have history of asthma and occasionally have to use rescue inhaler but does not associate her shortness of breath with her asthma attack.  She does endorse occasional lung irritation from changes in her environment such as making certain types of food and with certain fumes.  Last Shortness of breath episode was earlier today.  No active symptoms at this time  Home Medications Prior to Admission medications   Medication Sig Start Date End Date Taking? Authorizing Provider  albuterol (VENTOLIN HFA) 108 (90 Base) MCG/ACT inhaler Inhale 1-2 puffs into the lungs as needed for wheezing or shortness of breath. 11/13/19   [provider]  Cholecalciferol (VITAMIN D PO) Take 1,000 Units by mouth daily.    [provider]  Methylcobalamin 1000 MCG SUBL Place 1,000 mcg under the tongue 4 (four) times a week.    [provider]  OVER THE COUNTER MEDICATION Take 14 drops by  mouth daily. Algae based Omega 3    [provider]      Allergies    Benadryl [diphenhydramine hcl (sleep)], Haemophilus b polysaccharide vaccine, Promethazine, Vitamin d analogs, Ciprofloxacin, Codeine, Depo-medrol [methylprednisolone sodium succ], Fluzone [influenza virus vaccine], Iodinated contrast media, Iodine, Levaquin [levofloxacin], Macrobid [nitrofurantoin macrocrystal], Percocet [oxycodone-acetaminophen], Amoxicillin, Bactrim [sulfamethoxazole-trimethoprim], Keflex [cephalexin], and Metrogel [metronidazole]    Review of Systems   Review of Systems  Respiratory:  Positive for shortness of breath.   All other systems reviewed and are negative.   Physical Exam Updated Vital Signs BP 125/70 (BP Location: Left Arm)   Pulse 81   Temp 98.2 F (36.8 C) (Oral)   Resp 15   SpO2 99%  Physical Exam Vitals and nursing note reviewed.  Constitutional:      General: She is not in acute distress.    Appearance: She is well-developed.  HENT:     Head: Atraumatic.  Eyes:     Conjunctiva/sclera: Conjunctivae normal.  Cardiovascular:     Rate and Rhythm: Normal rate and regular rhythm.  Pulmonary:     Effort: Pulmonary effort is normal.     Breath sounds: No wheezing, rhonchi or rales.  Abdominal:     Palpations: Abdomen is soft.     Tenderness: There is no abdominal tenderness.  Musculoskeletal:     Cervical back: Neck supple.     Right lower leg: No edema.     Left lower leg: No edema.  Skin:  Findings: No rash.  Neurological:     Mental Status: She is alert. Mental status is at baseline.  Psychiatric:        Mood and Affect: Mood normal.     ED Results / Procedures / Treatments   Labs (all labs ordered are listed, but only abnormal results are displayed) Labs Reviewed  COMPREHENSIVE METABOLIC PANEL - Abnormal; Notable for the following components:      Result Value   Glucose, Bld 126 (*)    All other components within normal limits  URINALYSIS, ROUTINE W  REFLEX MICROSCOPIC - Abnormal; Notable for the following components:   Color, Urine STRAW (*)    Specific Gravity, Urine 1.004 (*)    Hgb urine dipstick SMALL (*)    All other components within normal limits  CBC WITH DIFFERENTIAL/PLATELET    EKG None  Date: 11/13/2021  Rate: 74  Rhythm: normal sinus rhythm  QRS Axis: normal  Intervals: normal  ST/T Wave abnormalities: normal  Conduction Disutrbances: none  Narrative Interpretation:   Old EKG Reviewed: No significant changes noted    Radiology DG Chest 2 View  Result Date: 11/13/2021 CLINICAL DATA:  Worsening shortness of breath for 1 month.  Asthma. EXAM: CHEST - 2 VIEW COMPARISON:  02/12/2020 FINDINGS: The heart size and mediastinal contours are within normal limits. Aortic atherosclerotic calcification incidentally noted. Both lungs are clear. The visualized skeletal structures are unremarkable. IMPRESSION: No active cardiopulmonary disease. Electronically Signed   By: Marlaine Hind M.D.   On: 11/13/2021 12:01    Procedures Procedures    Medications Ordered in ED Medications - No data to display  ED Course/ Medical Decision Making/ A&P                           Medical Decision Making Amount and/or Complexity of Data Reviewed Labs: ordered. Radiology: ordered.   BP 125/70 (BP Location: Left Arm)   Pulse 81   Temp 98.2 F (36.8 C) (Oral)   Resp 15   SpO2 99%   28:86 PM 66 year old female who presenting with complaints of shortness of breath.  She endorsed intermittent episodes of shortness of breath for the past 1 to 2 months.  Shortness of breath sometimes brought on by eating a heavy meal.  States that she feels like she cannot cannot catch a full breath afterward.  Sometimes it is brought on by stress.  She denies any associated wheezing with shortness of breath and no associated chest pain.  No cold symptoms such as fever chills runny nose sneezing or coughing.  She does have history of asthma but felt her  shortness of breath episodes are a bit different.  No prior history of PE DVT denies any leg swelling or calf pain on oral hormone, recent surgery or prolonged bedrest.  On exam this is a well-appearing elderly female appears to be in no acute discomfort.  She speaks in complete sentences.  Heart and lung sounds normal no wheezes crackles rales heard.  Legs are soft nontender.  No evidence of peripheral edema and no signs to suggest CHF exacerbation or asthma exacerbation.  She is afebrile with stable normal vital sign and no hypoxia.  Labs, EKG, and imaging obtained independently viewed interpreted by me and agree with radiologist interpretation.  Urine without signs of urinary tract infection, electrolyte panels are reassuring, normal WBC, normal H&H, chest x-ray shows no active cardiopulmonary disease, EKG with normal sinus rhythm and no concerning  ischemic changes or arrhythmia.  Symptoms not consistent with ACS or PE.  I did discuss options of obtain D-dimer perform CT angiogram with patient but after discussing risk and benefit, we opted not for further work-up from that standpoint.  Return precaution was given.  At this time patient is stable to be discharged home.  She ambulate while maintaining 100% O2 on room air.  This patient presents to the ED for concern of sob, this involves an extensive number of treatment options, and is a complaint that carries with it a high risk of complications and morbidity.  The differential diagnosis includes asthma, copd, acs, pe, reactive airway disease, anxiety, covid, pleural effusion, chf  Co morbidities that complicate the patient evaluation asthma Additional history obtained:  Additional history obtained from husband External records from outside source obtained and reviewed including EMR including labs and imaging  Lab Tests:  I Ordered, and personally interpreted labs.  The pertinent results include:  as above  Imaging Studies ordered:  I ordered  imaging studies including CXR I independently visualized and interpreted imaging which showed normal finding I agree with the radiologist interpretation  Cardiac Monitoring:  The patient was maintained on a cardiac monitor.  I personally viewed and interpreted the cardiac monitored which showed an underlying rhythm of: NSR  Medicines ordered and prescription drug management:   I have reviewed the patients home medicines and have made adjustments as needed  Test Considered: chest CTA, but felt PE not likely  Critical Interventions: none   Problem List / ED Course: sob  Reevaluation:  After the interventions noted above, I reevaluated the patient and found that they have :improved  Social Determinants of Health: none  Dispostion:  After consideration of the diagnostic results and the patients response to treatment, I feel that the patent would benefit from outpt f/u.         Final Clinical Impression(s) / ED Diagnoses Final diagnoses:  Shortness of breath    Rx / DC Orders ED Discharge Orders     None         Domenic Moras, PA-C 11/13/21 1653    Malvin Johns, MD 11/13/21 2136

## 2021-11-13 NOTE — ED Notes (Signed)
Pt walked around green zone faster about faster than me and when we got back pt o2 was 100% and hr was 81.

## 2021-11-13 NOTE — ED Notes (Signed)
Pt refused discharge vital signs bc bp cuff is too tight

## 2021-11-14 LAB — COMPREHENSIVE METABOLIC PANEL
ALT: 13 U/L (ref 0–44)
AST: 21 U/L (ref 15–41)
Albumin: 4.2 g/dL (ref 3.5–5.0)
Alkaline Phosphatase: 78 U/L (ref 38–126)
Anion gap: 12 (ref 5–15)
BUN: 10 mg/dL (ref 8–23)
CO2: 20 mmol/L — ABNORMAL LOW (ref 22–32)
Calcium: 9.7 mg/dL (ref 8.9–10.3)
Chloride: 105 mmol/L (ref 98–111)
Creatinine, Ser: 0.66 mg/dL (ref 0.44–1.00)
GFR, Estimated: >60 mL/min (ref >60)
Glucose, Bld: 117 mg/dL — ABNORMAL HIGH (ref 70–99)
Potassium: 3.8 mmol/L (ref 3.5–5.1)
Sodium: 137 mmol/L (ref 135–145)
Total Bilirubin: 0.8 mg/dL (ref 0.3–1.2)
Total Protein: 7.4 g/dL (ref 6.5–8.1)

## 2021-11-14 LAB — MAGNESIUM: Magnesium: 2.2 mg/dL (ref 1.7–2.4)

## 2021-11-14 LAB — TSH: TSH: 2.996 u[IU]/mL (ref 0.350–4.500)

## 2021-11-14 LAB — TROPONIN I (HIGH SENSITIVITY): Troponin I (High Sensitivity): 3 ng/L (ref <18)

## 2021-11-14 NOTE — ED Notes (Signed)
Pt decided to leave while waiting for a room.  

## 2021-11-18 ENCOUNTER — Telehealth: Payer: Self-pay | Admitting: Cardiology

## 2021-11-18 NOTE — Telephone Encounter (Signed)
Pt c/o Shortness Of Breath: STAT if SOB developed within the last 24 hours or pt is noticeably SOB on the phone  1. Are you currently SOB (can you hear that pt is SOB on the phone)? No  2. How long have you been experiencing SOB?  Occurred a few days ago, patient began hyperventilating and had labored breathing--went to ED, saw PCP Still has episodes, but they are less pronounced   3. Are you SOB when sitting or when up moving around?  Episode occurred when the patient was walking  4. Are you currently experiencing any other symptoms?   No

## 2021-11-18 NOTE — Telephone Encounter (Signed)
Spoke with pt and reviewed her symptoms.  Pt reports her SOB felt different than asthma attacks in the past.  She reports hyperventilating and then having labored breathing for about 20 mins.  She is unsure of the cause and was seen in the ER without a diagnosis/cause for her s/s. She reports she has also had SOB but not to the same extent after walking on the treadmill.  Pt would like to have a 2 D Echo to rule out any problems with her heart.  Explained to pt most recent echo results and that echo doesn't demonstrate if there are any "blockages" in her coronary arteries.  She states she is unable to have multiple kinds of testing due to her allergies.  She denies any chest pain. Advised to f/u with PCP for further evaluation as she is concerned as could have had a reaction to "Lugos solution or sour dough bread."  Pt requests to be seen in this office as soon as possible.  Appt scheduled on Tuesday October 3 with Estella Husk, PA for further evaluation.

## 2021-11-21 NOTE — Progress Notes (Signed)
Cardiology Office Note:    Date:  11/22/2021   ID:  Felicia Bryant, DOB [birth year], MRN [id number]  PCP:  Sharilyn Sites, [facility] Cardiologist:  Candee Furbish, MD    Referring MD: Sharilyn Sites, MD   Chief Complaint:  Chest Pain     History of Present Illness:   Felicia Bryant is a 66 y.o. female with  history of chest pain, palpitations following epinephrine for dental procedure, aortic sclerosis noted on echo.  Patient last saw Dr. Marlou Porch 03/2020 and no zio ordered b/c palpitations improved. Echo normal heart function,      Patient was in the ED 11/13/21 with dyspnea different than her asthma. Occurred after eating a heavy meal or feeling anxious. EKG and all labs stable.   Patient comes in with her husband. She's wondering if the chest squeezing and dyspnea was anxiety. Albuterol didn't help. Worse with exertion going into neck. Very anxious.     Past Medical History:  Diagnosis Date   Asthma    Complication of anesthesia    pt states i have had trouble with my throat closing after i had a tube down my throat before."   Palpitations    seen Dr.Skains in 2009..holter with pac's   Palpitations    echocardiogram in 4098 with diastolic dysfunction EF 57 %   Seizures ([place])    had seizure as child from vaccines and has had not seizures since was a child'.   Current Medications: Current Meds  Medication Sig   albuterol (VENTOLIN HFA) 108 (90 Base) MCG/ACT inhaler Inhale 1-2 puffs into the lungs as needed for wheezing or shortness of breath.   Cholecalciferol (VITAMIN D PO) Take 1,000 Units by mouth daily.   Methylcobalamin 1000 MCG SUBL Place 1,000 mcg under the tongue 4 (four) times a week.   OVER THE COUNTER MEDICATION Take 14 drops by mouth daily. Algae based Omega 3    Allergies:   Benadryl [diphenhydramine hcl (sleep)], Haemophilus b polysaccharide vaccine, Promethazine, Vitamin d analogs, Ciprofloxacin, Codeine, Depo-medrol [methylprednisolone sodium  succ], Fluzone [influenza virus vaccine], Iodinated contrast media, Iodine, Levaquin [levofloxacin], Macrobid [nitrofurantoin macrocrystal], Percocet [oxycodone-acetaminophen], Amoxicillin, Bactrim [sulfamethoxazole-trimethoprim], Keflex [cephalexin], and Metrogel [metronidazole]   Social History   Tobacco Use   Smoking status: Never   Smokeless tobacco: Never  Vaping Use   Vaping Use: Never used  Substance Use Topics   Alcohol use: No   Drug use: No    Family Hx: The patient's family history includes Dementia in her mother; Heart attack in her father. There is no history of Stroke.  ROS   EKGs/Labs/Other Test Reviewed:    EKG:  EKG is   ordered today.  The ekg ordered today demonstrates NSR with first degree AV block.   Recent Labs: 11/13/2021: ALT 13; BUN 10; Creatinine, Ser 0.66; Hemoglobin 12.9; Magnesium 2.2; Platelets 257; Potassium 3.8; Sodium 137; TSH 2.996   Recent Lipid Panel No results for input(s): "CHOL", "TRIG", "HDL", "VLDL", "LDLCALC", "LDLDIRECT" in the last 8760 hours.   Prior CV Studies: ECHO COMPLETE WO IMAGING ENHANCING AGENT 04/05/2020  Narrative ECHOCARDIOGRAM REPORT    Patient Name:   Felicia Bryant Date of Exam: 04/05/2020 Medical Rec #:  [id number]  Height:       66.0 in Accession #:    [id number] Weight:       154.0 lb Date of Birth:  21-Mar-1955 BSA:          1.790 m Patient Age:    68  years   BP:           120/80 mmHg Patient Gender: F          HR:           73 bpm. Exam Location:  Outpatient  Procedure: 2D Echo  Indications:    Dyspnea R06.00  History:        Patient has prior history of Echocardiogram examinations, most recent 03/19/2020.  Sonographer:    Mikki Santee RDCS (AE) Referring Phys: [id number] MARK C SKAINS  IMPRESSIONS   1. Left ventricular ejection fraction, by estimation, is 60 to 65%. The left ventricle has normal function. The left ventricle has no regional wall motion abnormalities. Left ventricular diastolic parameters  are indeterminate. 2. Right ventricular systolic function is normal. The right ventricular size is normal. There is normal pulmonary artery systolic pressure. The estimated right ventricular systolic pressure is 14.4 mmHg. 3. The mitral valve is normal in structure. Mild mitral valve regurgitation. No evidence of mitral stenosis. 4. The aortic valve is tricuspid. Aortic valve regurgitation is not visualized. Mild aortic valve sclerosis is present, with no evidence of aortic valve stenosis. 5. The inferior vena cava is normal in size with greater than 50% respiratory variability, suggesting right atrial pressure of 3 mmHg.  FINDINGS Left Ventricle: Left ventricular ejection fraction, by estimation, is 60 to 65%. The left ventricle has normal function. The left ventricle has no regional wall motion abnormalities. The left ventricular internal cavity size was normal in size. There is no left ventricular hypertrophy. Left ventricular diastolic parameters are indeterminate. Normal left ventricular filling pressure.  Right Ventricle: The right ventricular size is normal. No increase in right ventricular wall thickness. Right ventricular systolic function is normal. There is normal pulmonary artery systolic pressure. The tricuspid regurgitant velocity is 1.99 m/s, and with an assumed right atrial pressure of 3 mmHg, the estimated right ventricular systolic pressure is 81.8 mmHg.  Left Atrium: Left atrial size was normal in size.  Right Atrium: Right atrial size was normal in size.  Pericardium: There is no evidence of pericardial effusion.  Mitral Valve: The mitral valve is normal in structure. Mild mitral annular calcification. Mild mitral valve regurgitation. No evidence of mitral valve stenosis.  Tricuspid Valve: The tricuspid valve is normal in structure. Tricuspid valve regurgitation is trivial. No evidence of tricuspid stenosis.  Aortic Valve: The aortic valve is tricuspid. Aortic valve  regurgitation is not visualized. Mild aortic valve sclerosis is present, with no evidence of aortic valve stenosis.  Pulmonic Valve: The pulmonic valve was normal in structure. Pulmonic valve regurgitation is not visualized. No evidence of pulmonic stenosis.  Aorta: The aortic root is normal in size and structure.  Venous: The inferior vena cava is normal in size with greater than 50% respiratory variability, suggesting right atrial pressure of 3 mmHg.  IAS/Shunts: No atrial level shunt detected by color flow Doppler.   LEFT VENTRICLE PLAX 2D LVIDd:         4.50 cm     Diastology LVIDs:         2.80 cm     LV e' medial:    7.40 cm/s LV PW:         0.80 cm     LV E/e' medial:  12.0 LV IVS:        0.90 cm     LV e' lateral:   9.36 cm/s LVOT diam:     2.10 cm     LV E/e'  lateral: 9.5 LV SV:         61 LV SV Index:   34 LVOT Area:     3.46 cm  LV Volumes (MOD) LV vol d, MOD A2C: 80.9 ml LV vol d, MOD A4C: 98.6 ml LV vol s, MOD A2C: 33.0 ml LV vol s, MOD A4C: 33.5 ml LV SV MOD A2C:     47.9 ml LV SV MOD A4C:     98.6 ml LV SV MOD BP:      55.5 ml  RIGHT VENTRICLE RV S prime:     13.60 cm/s TAPSE (M-mode): 2.3 cm  LEFT ATRIUM             Index       RIGHT ATRIUM           Index LA diam:        2.50 cm 1.40 cm/m  RA Area:     10.10 cm LA Vol (A2C):   46.4 ml 25.93 ml/m RA Volume:   19.10 ml  10.67 ml/m LA Vol (A4C):   39.5 ml 22.07 ml/m LA Biplane Vol: 43.1 ml 24.08 ml/m AORTIC VALVE LVOT Vmax:   98.30 cm/s LVOT Vmean:  64.100 cm/s LVOT VTI:    0.176 m  AORTA Ao Root diam: 3.30 cm  MITRAL VALVE               TRICUSPID VALVE MV Area (PHT): 3.83 cm    TR Peak grad:   15.8 mmHg MV Decel Time: 198 msec    TR Vmax:        199.00 cm/s MV E velocity: 88.60 cm/s MV A velocity: 78.20 cm/s  SHUNTS MV E/A ratio:  1.13        Systemic VTI:  0.18 m Systemic Diam: 2.10 cm  Fransico Him MD Electronically signed by Fransico Him MD Signature Date/Time: 04/06/2020/7:53:55  AM    Final         Risk Assessment/Calculations/Metrics:              Physical Exam:    VS:  BP 118/66   Pulse 70   Ht 5' 5.5" (1.664 m)   Wt 145 lb 6.4 oz (66 kg)   SpO2 98%   BMI 23.83 kg/m     Wt Readings from Last 3 Encounters:  11/22/21 145 lb 6.4 oz (66 kg)  04/12/20 153 lb (69.4 kg)  03/17/20 154 lb (69.9 kg)    Physical Exam  GEN: Well nourished, well developed, in no acute distress  Neck: no JVD, carotid bruits, or masses Cardiac:RRR; no murmurs, rubs, or gallops  Respiratory:  clear to auscultation bilaterally, normal work of breathing GI: soft, nontender, nondistended, + BS Ext: without cyanosis, clubbing, or edema, Good distal pulses bilaterally Neuro:  Alert and Oriented x 3,  Psych: anxious, full affect       ASSESSMENT & PLAN:   No problem-specific Assessment & Plan notes found for this encounter.   Chest pain and DOE worse recently. Echo normal 04/2021. Multiple allergies and doesn't want contrast. EKG today normal. Discussed going back to ED with worsening symptoms but would like to try exercise myoview.  Will order. Again, ED precautions reviewed.        Shared Decision Making/Informed Consent The risks [chest pain, shortness of breath, cardiac arrhythmias, dizziness, blood pressure fluctuations, myocardial infarction, stroke/transient ischemic attack, nausea, vomiting, allergic reaction, radiation exposure, metallic taste sensation and life-threatening complications (estimated to be 1 in 10,000)], benefits (risk  stratification, diagnosing coronary artery disease, treatment guidance) and alternatives of a nuclear stress test were discussed in detail with Felicia Bryant and she agrees to proceed.   Dispo:  No follow-ups on file.   Medication Adjustments/Labs and Tests Ordered: Current medicines are reviewed at length with the patient today.  Concerns regarding medicines are outlined above.  Tests Ordered: Orders Placed This Encounter  Procedures    MYOCARDIAL PERFUSION IMAGING   EKG 12-Lead   Medication Changes: No orders of the defined types were placed in this encounter.  Signed, Ermalinda Barrios, PA-C  11/22/2021 9:48 AM    [facility] [address], [place], [place]  [address] Phone: [phone]; Fax: [phone]

## 2021-11-22 ENCOUNTER — Observation Stay (HOSPITAL_COMMUNITY)
Admission: EM | Admit: 2021-11-22 | Discharge: 2021-11-24 | Disposition: A | Discharge status: Home / Self Care | Payer: Medicare Other | Attending: Internal Medicine | Admitting: Internal Medicine

## 2021-11-22 ENCOUNTER — Ambulatory Visit: Payer: Medicare Other | Attending: Physician Assistant | Admitting: Physician Assistant

## 2021-11-22 ENCOUNTER — Encounter (HOSPITAL_COMMUNITY): Payer: Self-pay | Admitting: Emergency Medicine

## 2021-11-22 ENCOUNTER — Telehealth: Payer: Self-pay

## 2021-11-22 ENCOUNTER — Encounter: Payer: Self-pay | Admitting: Physician Assistant

## 2021-11-22 ENCOUNTER — Telehealth (HOSPITAL_COMMUNITY): Payer: Self-pay | Admitting: *Deleted

## 2021-11-22 ENCOUNTER — Telehealth: Payer: Self-pay | Admitting: Physician Assistant

## 2021-11-22 ENCOUNTER — Encounter: Payer: Self-pay | Admitting: *Deleted

## 2021-11-22 ENCOUNTER — Other Ambulatory Visit: Payer: Self-pay

## 2021-11-22 VITALS — BP 118/66 | HR 70 | Ht 65.5 in | Wt 145.4 lb

## 2021-11-22 DIAGNOSIS — R072 Precordial pain: Secondary | ICD-10-CM | POA: Diagnosis not present

## 2021-11-22 DIAGNOSIS — R079 Chest pain, unspecified: Principal | ICD-10-CM | POA: Diagnosis present

## 2021-11-22 DIAGNOSIS — E8721 Acute metabolic acidosis: Secondary | ICD-10-CM | POA: Insufficient documentation

## 2021-11-22 DIAGNOSIS — J45909 Unspecified asthma, uncomplicated: Secondary | ICD-10-CM | POA: Diagnosis not present

## 2021-11-22 DIAGNOSIS — R0609 Other forms of dyspnea: Secondary | ICD-10-CM

## 2021-11-22 DIAGNOSIS — R0600 Dyspnea, unspecified: Secondary | ICD-10-CM | POA: Diagnosis not present

## 2021-11-22 DIAGNOSIS — R0602 Shortness of breath: Secondary | ICD-10-CM | POA: Diagnosis not present

## 2021-11-22 DIAGNOSIS — I200 Unstable angina: Secondary | ICD-10-CM | POA: Diagnosis not present

## 2021-11-22 DIAGNOSIS — Z79899 Other long term (current) drug therapy: Secondary | ICD-10-CM | POA: Insufficient documentation

## 2021-11-22 DIAGNOSIS — E871 Hypo-osmolality and hyponatremia: Secondary | ICD-10-CM | POA: Diagnosis not present

## 2021-11-22 DIAGNOSIS — E7800 Pure hypercholesterolemia, unspecified: Secondary | ICD-10-CM

## 2021-11-22 DIAGNOSIS — Z8542 Personal history of malignant neoplasm of other parts of uterus: Secondary | ICD-10-CM | POA: Diagnosis not present

## 2021-11-22 LAB — BASIC METABOLIC PANEL
Anion gap: 12 (ref 5–15)
BUN: 11 mg/dL (ref 8–23)
CO2: 20 mmol/L — ABNORMAL LOW (ref 22–32)
Calcium: 9.9 mg/dL (ref 8.9–10.3)
Chloride: 102 mmol/L (ref 98–111)
Creatinine, Ser: 0.74 mg/dL (ref 0.44–1.00)
GFR, Estimated: >60 mL/min (ref >60)
Glucose, Bld: 103 mg/dL — ABNORMAL HIGH (ref 70–99)
Potassium: 3.8 mmol/L (ref 3.5–5.1)
Sodium: 134 mmol/L — ABNORMAL LOW (ref 135–145)

## 2021-11-22 LAB — CBC
HCT: 39.3 % (ref 36.0–46.0)
Hemoglobin: 13.9 g/dL (ref 12.0–15.0)
MCH: 31.4 pg (ref 26.0–34.0)
MCHC: 35.4 g/dL (ref 30.0–36.0)
MCV: 88.7 fL (ref 80.0–100.0)
Platelets: 266 10*3/uL (ref 150–400)
RBC: 4.43 MIL/uL (ref 3.87–5.11)
RDW: 11.8 % (ref 11.5–15.5)
WBC: 7.9 10*3/uL (ref 4.0–10.5)
nRBC: 0 % (ref 0.0–0.2)

## 2021-11-22 LAB — TROPONIN I (HIGH SENSITIVITY): Troponin I (High Sensitivity): 3 ng/L (ref <18)

## 2021-11-22 MED ORDER — ASPIRIN 325 MG PO TABS
325.0000 mg | ORAL_TABLET | Freq: Every day | ORAL | Status: DC
  Administered 2021-11-23: 325 mg via ORAL
  Filled 2021-11-22 (×2): qty 1

## 2021-11-22 MED ORDER — NITROGLYCERIN 2 % TD OINT
1.0000 [in_us] | TOPICAL_OINTMENT | Freq: Once | TRANSDERMAL | Status: AC
  Administered 2021-11-23: 1 [in_us] via TOPICAL
  Filled 2021-11-22: qty 1

## 2021-11-22 NOTE — Addendum Note (Signed)
Addended by: Drue Novel I on: 11/22/2021 10:50 AM   Modules accepted: Orders

## 2021-11-22 NOTE — ED Triage Notes (Signed)
Patient arrives in wheelchair by POV c/o shortness of breath when standing or walking and having a pain in her chest. Pain is central chest and describes it as a pressure and feels like "something is moving around in it." Patient speaking in full sentences. Sent her for further evaluation from cardiology.

## 2021-11-22 NOTE — Telephone Encounter (Signed)
New Message:     Patient says she would like for  Felicia Bryant to give her a call today when she have time. She is scheduled for a Stress Test on Friday. 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

## 2021-11-22 NOTE — Telephone Encounter (Signed)
Patient given detailed instructions per Myocardial Perfusion Study Information Sheet for the test on 11/25/21 at 0715. Patient notified to arrive 15 minutes early and that it is imperative to arrive on time for appointment to keep from having the test rescheduled.  If you need to cancel or reschedule your appointment, please call the office within 24 hours of your appointment. . Patient verbalized understanding.Felicia Bryant, Ranae Palms

## 2021-11-22 NOTE — Patient Instructions (Signed)
Medication Instructions:  Your physician recommends that you continue on your current medications as directed. Please refer to the Current Medication list given to you today.   *If you need a refill on your cardiac medications before your next appointment, please call your pharmacy*   Lab Work: None ordered  If you have labs (blood work) drawn today and your tests are completely normal, you will receive your results only by: [place] (if you have MyChart) OR A paper copy in the mail If you have any lab test that is abnormal or we need to change your treatment, we will call you to review the results.   Testing/Procedures: Your physician has requested that you have en exercise stress myoview. For further information please visit [email or web address]. Please follow instruction sheet, as given.    Follow-Up: At [facility], you and your health needs are our priority.  As part of our continuing mission to provide you with exceptional heart care, we have created designated Provider Care Teams.  These Care Teams include your primary Cardiologist (physician) and Advanced Practice Providers (APPs -  Physician Assistants and Nurse Practitioners) who all work together to provide you with the care you need, when you need it.  We recommend signing up for the patient portal called "MyChart".  Sign up information is provided on this After Visit Summary.  MyChart is used to connect with patients for Virtual Visits (Telemedicine).  Patients are able to view lab/test results, encounter notes, upcoming appointments, etc.  Non-urgent messages can be sent to your provider as well.   To learn more about what you can do with MyChart, go to [email or web address].    Your next appointment:   After testing   The format for your next appointment:   In Person  Provider:   Candee Furbish, MD     Other Instructions   Important Information About Sugar

## 2021-11-22 NOTE — ED Provider Triage Note (Signed)
Emergency Medicine Provider Triage Evaluation Note  Ahmyah Gidley , a 66 y.o. female  was evaluated in triage.  Pt complains of chest pain in the middle of her chest feels like pressure.  Some going on for the last 2 years worsened recently.  Seen by cardiologist earlier today, scheduled for nuclear study on Friday but is worried that study has "too much radiation" and came to ED for additional evaluation for the chest pain.  Review of Systems  Positive: CP, SOB Negative: V, back pain  Physical Exam  BP 128/73 (BP Location: Left Arm)   Pulse 81   Temp 98.1 F (36.7 C) (Oral)   Resp 16   Ht 5' 5.5" (1.664 m)   Wt 65.8 kg   SpO2 100%   BMI 23.76 kg/m  Gen:   Awake, anxious Resp:  Normal effort  MSK:   Moves extremities without difficulty  Other:    Medical Decision Making  Medically screening exam initiated at 3:24 PM.  Appropriate orders placed.  Maryagnes Carrasco was informed that the remainder of the evaluation will be completed by another provider, this initial triage assessment does not replace that evaluation, and the importance of remaining in the ED until their evaluation is complete.     Sherrill Raring, PA-C 11/22/21 1526

## 2021-11-22 NOTE — Telephone Encounter (Signed)
Please sign Attestation Order for Stress Test. Thanks

## 2021-11-23 ENCOUNTER — Emergency Department (HOSPITAL_COMMUNITY): Payer: Medicare Other

## 2021-11-23 ENCOUNTER — Institutional Professional Consult (permissible substitution): Payer: 59 | Admitting: Pulmonary Disease

## 2021-11-23 ENCOUNTER — Other Ambulatory Visit: Payer: Self-pay

## 2021-11-23 ENCOUNTER — Encounter (HOSPITAL_COMMUNITY)
Admission: EM | Disposition: A | Discharge status: Home / Self Care | Payer: Self-pay | Source: Home / Self Care | Attending: Emergency Medicine

## 2021-11-23 ENCOUNTER — Observation Stay (HOSPITAL_BASED_OUTPATIENT_CLINIC_OR_DEPARTMENT_OTHER): Payer: Medicare Other

## 2021-11-23 DIAGNOSIS — I200 Unstable angina: Secondary | ICD-10-CM

## 2021-11-23 DIAGNOSIS — R0609 Other forms of dyspnea: Secondary | ICD-10-CM | POA: Diagnosis not present

## 2021-11-23 DIAGNOSIS — R079 Chest pain, unspecified: Secondary | ICD-10-CM | POA: Diagnosis present

## 2021-11-23 DIAGNOSIS — E7800 Pure hypercholesterolemia, unspecified: Secondary | ICD-10-CM | POA: Diagnosis not present

## 2021-11-23 DIAGNOSIS — R072 Precordial pain: Secondary | ICD-10-CM

## 2021-11-23 HISTORY — PX: LEFT HEART CATH AND CORONARY ANGIOGRAPHY: CATH118249

## 2021-11-23 LAB — LIPID PANEL
Cholesterol: 190 mg/dL (ref 0–200)
HDL: 80 mg/dL (ref >40)
LDL Cholesterol: 102 mg/dL — ABNORMAL HIGH (ref 0–99)
Total CHOL/HDL Ratio: 2.4 RATIO
Triglycerides: 40 mg/dL (ref <150)
VLDL: 8 mg/dL (ref 0–40)

## 2021-11-23 LAB — CBC
HCT: 39 % (ref 36.0–46.0)
Hemoglobin: 13.5 g/dL (ref 12.0–15.0)
MCH: 31.3 pg (ref 26.0–34.0)
MCHC: 34.6 g/dL (ref 30.0–36.0)
MCV: 90.5 fL (ref 80.0–100.0)
Platelets: 262 10*3/uL (ref 150–400)
RBC: 4.31 MIL/uL (ref 3.87–5.11)
RDW: 11.9 % (ref 11.5–15.5)
WBC: 7.3 10*3/uL (ref 4.0–10.5)
nRBC: 0 % (ref 0.0–0.2)

## 2021-11-23 LAB — PHOSPHORUS: Phosphorus: 3 mg/dL (ref 2.5–4.6)

## 2021-11-23 LAB — MAGNESIUM: Magnesium: 2 mg/dL (ref 1.7–2.4)

## 2021-11-23 LAB — HEMOGLOBIN A1C
Hgb A1c MFr Bld: 5.4 % (ref 4.8–5.6)
Mean Plasma Glucose: 108.28 mg/dL

## 2021-11-23 LAB — ECHOCARDIOGRAM COMPLETE
AR max vel: 2.72 cm2
AV Area VTI: 2.57 cm2
AV Area mean vel: 2.57 cm2
AV Mean grad: 3 mmHg
AV Peak grad: 5.6 mmHg
Ao pk vel: 1.18 m/s
Area-P 1/2: 4.89 cm2
Height: 65.5 in
S' Lateral: 2.8 cm
Weight: 2320 oz

## 2021-11-23 LAB — CREATININE, SERUM
Creatinine, Ser: 0.74 mg/dL (ref 0.44–1.00)
GFR, Estimated: >60 mL/min (ref >60)

## 2021-11-23 LAB — D-DIMER, QUANTITATIVE: D-Dimer, Quant: 0.47 ug/mL-FEU (ref 0.00–0.50)

## 2021-11-23 LAB — TROPONIN I (HIGH SENSITIVITY): Troponin I (High Sensitivity): 3 ng/L (ref <18)

## 2021-11-23 LAB — HIV ANTIBODY (ROUTINE TESTING W REFLEX): HIV Screen 4th Generation wRfx: NONREACTIVE

## 2021-11-23 SURGERY — LEFT HEART CATH AND CORONARY ANGIOGRAPHY
Anesthesia: LOCAL

## 2021-11-23 MED ORDER — VERAPAMIL HCL 2.5 MG/ML IV SOLN
INTRAVENOUS | Status: DC | PRN
  Administered 2021-11-23: 10 mL via INTRA_ARTERIAL

## 2021-11-23 MED ORDER — HEPARIN SODIUM (PORCINE) 1000 UNIT/ML IJ SOLN
INTRAMUSCULAR | Status: DC | PRN
  Administered 2021-11-23: 3500 [IU] via INTRAVENOUS

## 2021-11-23 MED ORDER — ONDANSETRON HCL 4 MG/2ML IJ SOLN: 4.0000 mg | Freq: Four times a day (QID) | INTRAMUSCULAR | Status: DC | PRN

## 2021-11-23 MED ORDER — SODIUM CHLORIDE 0.9 % IV SOLN: INTRAVENOUS | Status: DC

## 2021-11-23 MED ORDER — LIDOCAINE HCL (PF) 1 % IJ SOLN
INTRAMUSCULAR | Status: DC | PRN
  Administered 2021-11-23: 2 mL via SUBCUTANEOUS

## 2021-11-23 MED ORDER — HEPARIN SODIUM (PORCINE) 1000 UNIT/ML IJ SOLN
INTRAMUSCULAR | Status: AC
  Filled 2021-11-23: qty 10

## 2021-11-23 MED ORDER — LABETALOL HCL 5 MG/ML IV SOLN: 10.0000 mg | INTRAVENOUS | Status: AC | PRN

## 2021-11-23 MED ORDER — DIPHENHYDRAMINE HCL 50 MG/ML IJ SOLN
25.0000 mg | Freq: Once | INTRAMUSCULAR | Status: AC
  Administered 2021-11-23: 25 mg via INTRAVENOUS
  Filled 2021-11-23 (×2): qty 1

## 2021-11-23 MED ORDER — SODIUM CHLORIDE 0.9% FLUSH: 3.0000 mL | INTRAVENOUS | Status: DC | PRN

## 2021-11-23 MED ORDER — POLYETHYLENE GLYCOL 3350 17 G PO PACK: 17.0000 g | PACK | Freq: Every day | ORAL | Status: DC | PRN

## 2021-11-23 MED ORDER — ASPIRIN 81 MG PO CHEW: 81.0000 mg | CHEWABLE_TABLET | ORAL | Status: AC

## 2021-11-23 MED ORDER — SODIUM CHLORIDE 0.9 % IV SOLN: 250.0000 mL | INTRAVENOUS | Status: DC | PRN

## 2021-11-23 MED ORDER — ACETAMINOPHEN 325 MG PO TABS: 650.0000 mg | ORAL_TABLET | ORAL | Status: DC | PRN

## 2021-11-23 MED ORDER — MIDAZOLAM HCL 2 MG/2ML IJ SOLN
INTRAMUSCULAR | Status: DC | PRN
  Administered 2021-11-23: 2 mg via INTRAVENOUS

## 2021-11-23 MED ORDER — HYDRALAZINE HCL 20 MG/ML IJ SOLN: 10.0000 mg | INTRAMUSCULAR | Status: AC | PRN

## 2021-11-23 MED ORDER — SODIUM CHLORIDE 0.9 % IV SOLN: INTRAVENOUS | Status: AC

## 2021-11-23 MED ORDER — ACETAMINOPHEN 325 MG PO TABS: 650.0000 mg | ORAL_TABLET | Freq: Four times a day (QID) | ORAL | Status: DC | PRN

## 2021-11-23 MED ORDER — ASPIRIN 81 MG PO CHEW
81.0000 mg | CHEWABLE_TABLET | Freq: Every day | ORAL | Status: DC
  Administered 2021-11-24: 81 mg via ORAL
  Filled 2021-11-23: qty 1

## 2021-11-23 MED ORDER — FENTANYL CITRATE (PF) 100 MCG/2ML IJ SOLN
INTRAMUSCULAR | Status: AC
  Filled 2021-11-23: qty 2

## 2021-11-23 MED ORDER — MELATONIN 5 MG PO TABS: 5.0000 mg | ORAL_TABLET | Freq: Every evening | ORAL | Status: DC | PRN

## 2021-11-23 MED ORDER — HEPARIN (PORCINE) IN NACL 1000-0.9 UT/500ML-% IV SOLN
INTRAVENOUS | Status: DC | PRN
  Administered 2021-11-23 (×2): 500 mL

## 2021-11-23 MED ORDER — ENOXAPARIN SODIUM 40 MG/0.4ML IJ SOSY: 40.0000 mg | PREFILLED_SYRINGE | INTRAMUSCULAR | Status: DC

## 2021-11-23 MED ORDER — IOHEXOL 350 MG/ML SOLN
INTRAVENOUS | Status: DC | PRN
  Administered 2021-11-23: 30 mL

## 2021-11-23 MED ORDER — MIDAZOLAM HCL 2 MG/2ML IJ SOLN
INTRAMUSCULAR | Status: AC
  Filled 2021-11-23: qty 2

## 2021-11-23 MED ORDER — FAMOTIDINE IN NACL 20-0.9 MG/50ML-% IV SOLN
20.0000 mg | Freq: Once | INTRAVENOUS | Status: DC
  Filled 2021-11-23: qty 50

## 2021-11-23 MED ORDER — LIDOCAINE HCL (PF) 1 % IJ SOLN
INTRAMUSCULAR | Status: AC
  Filled 2021-11-23: qty 30

## 2021-11-23 MED ORDER — FENTANYL CITRATE (PF) 100 MCG/2ML IJ SOLN
INTRAMUSCULAR | Status: DC | PRN
  Administered 2021-11-23: 25 ug via INTRAVENOUS

## 2021-11-23 MED ORDER — VERAPAMIL HCL 2.5 MG/ML IV SOLN
INTRAVENOUS | Status: AC
  Filled 2021-11-23: qty 2

## 2021-11-23 MED ORDER — METHYLPREDNISOLONE SODIUM SUCC 125 MG IJ SOLR
125.0000 mg | Freq: Once | INTRAMUSCULAR | Status: AC
  Administered 2021-11-23: 125 mg via INTRAVENOUS
  Filled 2021-11-23 (×2): qty 2

## 2021-11-23 MED ORDER — SODIUM CHLORIDE 0.9% FLUSH
3.0000 mL | Freq: Two times a day (BID) | INTRAVENOUS | Status: DC
  Administered 2021-11-24: 3 mL via INTRAVENOUS

## 2021-11-23 MED ORDER — SODIUM CHLORIDE 0.9% FLUSH
3.0000 mL | Freq: Two times a day (BID) | INTRAVENOUS | Status: DC
  Administered 2021-11-23 (×2): 3 mL via INTRAVENOUS

## 2021-11-23 MED ORDER — HEPARIN (PORCINE) IN NACL 1000-0.9 UT/500ML-% IV SOLN
INTRAVENOUS | Status: AC
  Filled 2021-11-23: qty 1000

## 2021-11-23 SURGICAL SUPPLY — 12 items
BAND ZEPHYR COMPRESS 30 LONG (HEMOSTASIS) IMPLANT
CATH INFINITI JR4 5F (CATHETERS) IMPLANT
CATH OPTITORQUE TIG 4.0 5F (CATHETERS) IMPLANT
DEVICE RAD COMP TR BAND LRG (VASCULAR PRODUCTS) IMPLANT
GLIDESHEATH SLEND SS 6F .021 (SHEATH) IMPLANT
GUIDEWIRE INQWIRE 1.5J.035X260 (WIRE) IMPLANT
INQWIRE 1.5J .035X260CM (WIRE) ×1
KIT ENCORE 26 ADVANTAGE (KITS) IMPLANT
KIT HEART LEFT (KITS) ×1 IMPLANT
PACK CARDIAC CATHETERIZATION (CUSTOM PROCEDURE TRAY) ×1 IMPLANT
TRANSDUCER W/STOPCOCK (MISCELLANEOUS) ×1 IMPLANT
TUBING CIL FLEX 10 FLL-RA (TUBING) ×1 IMPLANT

## 2021-11-23 NOTE — H&P (Signed)
History and Physical  Felicia Bryant TIR:[id number] DOB: May 13, 1955 DOA: 11/22/2021  Referring physician: Dr. Dina Bryant, EDP  PCP: Felicia Sites, MD  Outpatient Specialists: Cardiology Patient coming from: Home.  Chief Complaint: Chest pain  HPI: Felicia Bryant is a 66 y.o. female with medical history significant for asthma, aortic sclerosis noted on prior 2D echocardiogram, history of palpitations following epinephrine injection for dental procedure, who presented to [facility] ED from home due to progressively worsening exertional dyspnea and intermittent chest tightness of 10 days duration.  Seen by cardiology in the outpatient setting prior to presentation, plan was to proceed with nuclear stress test outpatient.  However due to worsening symptoms she presented to the ED today for further evaluation.    In the ED, high-sensitivity troponin negative x1.  No evidence of acute ischemia on 12-lead EKG.  EDP consulted cardiology who will see in consultation, possible stress test in the morning.  ED Course: Tmax 98.1.  BP 115/78, pulse 81, respiratory rate 20, O2 saturation 100% on room air.  Lab studies remarkable for serum sodium 134, bicarb 20, glucose 103.  Review of Systems: Review of systems as noted in the HPI. All other systems reviewed and are negative.   Past Medical History:  Diagnosis Date   Asthma    Complication of anesthesia    pt states i have had trouble with my throat closing after i had a tube down my throat before."   Palpitations    seen Felicia Bryant in 2009..holter with pac's   Palpitations    echocardiogram in 6761 with diastolic dysfunction EF 57 %   Seizures ([place])    had seizure as child from vaccines and has had not seizures since was a child'.   Past Surgical History:  Procedure Laterality Date   CATARACT EXTRACTION W/PHACO Right 11/30/2014   Procedure: CATARACT EXTRACTION PHACO AND INTRAOCULAR LENS PLACEMENT (IOC);  Surgeon: Felicia Branch, MD;  Location: [facility];  Service:  Ophthalmology;  Laterality: Right;  CDE:10.59   CATARACT EXTRACTION W/PHACO Left 03/04/2018   Procedure: CATARACT EXTRACTION PHACO AND INTRAOCULAR LENS PLACEMENT (IOC);  Surgeon: Felicia Branch, MD;  Location: [facility];  Service: Ophthalmology;  Laterality: Left;  CDE: 8.40   CHOLECYSTECTOMY     conization of cervix     DILATION AND CURETTAGE OF UTERUS     episiotomy repair also.   TONSILLECTOMY      Social History:  reports that she has never smoked. She has never used smokeless tobacco. She reports that she does not drink alcohol and does not use drugs.   Allergies  Allergen Reactions   Benadryl [Diphenhydramine Hcl (Sleep)] Shortness Of Breath    iv   Haemophilus B Polysaccharide Vaccine     Other reaction(s): Other (See Comments) seizures   Promethazine Nausea And Vomiting   Vitamin D Analogs Nausea Only   Ciprofloxacin Other (See Comments)    Pain in stomach   Codeine Other (See Comments)    Pt reports "It makes me having panting breathing and my feet and hands go numb"   Fluzone [Influenza Virus Vaccine] Other (See Comments)    Heart Racing/Passed out.   Iodinated Contrast Media Other (See Comments)    heart    Iodine Nausea Only   Levaquin [Levofloxacin] Other (See Comments)    Heart rate 120's Tendon pops   Macrobid [Nitrofurantoin Macrocrystal] Nausea And Vomiting   Percocet [Oxycodone-Acetaminophen] Other (See Comments)    Pt reports "It makes me having panting breathing and my feet and hands  go numb"   Amoxicillin Rash   Bactrim [Sulfamethoxazole-Trimethoprim] Rash   Depo-Medrol [Methylprednisolone Sodium Succ] Palpitations    Racing heart, and flushing   Keflex [Cephalexin] Rash   Metrogel [Metronidazole] Palpitations    Family History  Problem Relation Age of Onset   Dementia Mother    Heart attack Father    Stroke Neg Hx       Prior to Admission medications   Medication Sig Start Date End Date Taking? Authorizing Provider  albuterol (VENTOLIN HFA) 108  (90 Base) MCG/ACT inhaler Inhale 1-2 puffs into the lungs as needed for wheezing or shortness of breath. 11/13/19   [provider]  Cholecalciferol (VITAMIN D PO) Take 1,000 Units by mouth daily.    [provider]  Methylcobalamin 1000 MCG SUBL Place 1,000 mcg under the tongue 4 (four) times a week.    [provider]  OVER THE COUNTER MEDICATION Take 14 drops by mouth daily. Algae based Omega 3    [provider]    Physical Exam: BP 115/78 (BP Location: Left Arm)   Pulse 81   Temp 97.6 F (36.4 C) (Oral)   Resp 20   Ht 5' 5.5" (1.664 m)   Wt 65.8 kg   SpO2 100%   BMI 23.76 kg/m   General: 66 y.o. year-old female well developed well nourished in no acute distress.  Alert and oriented x3. Cardiovascular: Regular rate and rhythm with no rubs or gallops.  No thyromegaly or JVD noted.  No lower extremity edema. 2/4 pulses in all 4 extremities. Respiratory: Clear to auscultation with no wheezes or rales. Good inspiratory effort. Abdomen: Soft nontender nondistended with normal bowel sounds x4 quadrants. Muskuloskeletal: No cyanosis, clubbing or edema noted bilaterally Neuro: CN II-XII intact, strength, sensation, reflexes Skin: No ulcerative lesions noted or rashes Psychiatry: Judgement and insight appear normal. Mood is appropriate for condition and setting          Labs on Admission:  Basic Metabolic Panel: Recent Labs  Lab 11/22/21 1526  NA 134*  K 3.8  CL 102  CO2 20*  GLUCOSE 103*  BUN 11  CREATININE 0.74  CALCIUM 9.9   Liver Function Tests: No results for input(s): "AST", "ALT", "ALKPHOS", "BILITOT", "PROT", "ALBUMIN" in the last 168 hours. No results for input(s): "LIPASE", "AMYLASE" in the last 168 hours. No results for input(s): "AMMONIA" in the last 168 hours. CBC: Recent Labs  Lab 11/22/21 1526  WBC 7.9  HGB 13.9  HCT 39.3  MCV 88.7  PLT 266   Cardiac Enzymes: No results for input(s): "CKTOTAL", "CKMB", "CKMBINDEX",  "TROPONINI" in the last 168 hours.  BNP (last 3 results) No results for input(s): "BNP" in the last 8760 hours.  ProBNP (last 3 results) No results for input(s): "PROBNP" in the last 8760 hours.  CBG: No results for input(s): "GLUCAP" in the last 168 hours.  Radiological Exams on Admission: No results found.  EKG: I independently viewed the EKG done and my findings are as followed: Normal sinus rhythm rate of 82.  Nonspecific ST-T changes.  QTc 408  Assessment/Plan Present on Admission:  Chest pain  Principal Problem:   Chest pain  Chest pain, rule out ACS First set of high-sensitivity troponin negative No evidence of acute ischemia on twelve-lead EKG TSH normal Obtain 2D echo Fasting lipid panel Cardiology consulted by EDP, will see in consultation, possible stress test in the AM.  Asthma No acute issues Resume home regimen  Situational anxiety P.o. hydroxyzine as  needed  Mild euvolemic hyponatremia Serum sodium 134 Normal saline at 50 cc/h.  Mild normal anion gap metabolic acidosis Serum bicarb 20, anion gap 12. Gentle IV fluid hydration NS at 50 cc/h x 1 day     DVT prophylaxis: Subcu Lovenox daily  Code Status: Full code  Family Communication: Updated the patient's husband at bedside  Disposition Plan: Admitted to telemetry cardiac unit  Consults called: Cardiology consulted by EDP  Admission status: Observation status   Status is: Observation    Kayleen Memos MD [facility] Pager [phone]  If 7PM-7AM, please contact night-coverage [email or web address] Password [email or web address]  11/23/2021, 12:46 AM

## 2021-11-23 NOTE — Interval H&P Note (Signed)
Cath Lab Visit (complete for each Cath Lab visit)  Clinical Evaluation Leading to the Procedure:   ACS: No.  Non-ACS:    Anginal Classification: CCS II  Anti-ischemic medical therapy: No Therapy  Non-Invasive Test Results: No non-invasive testing performed  Prior CABG: No previous CABG      History and Physical Interval Note:  11/23/2021 2:24 PM  Felicia Bryant  has presented today for surgery, with the diagnosis of chest pain.  The various methods of treatment have been discussed with the patient and family. After consideration of risks, benefits and other options for treatment, the patient has consented to  Procedure(s): LEFT HEART CATH AND CORONARY ANGIOGRAPHY (N/A) as a surgical intervention.  The patient's history has been reviewed, patient examined, no change in status, stable for surgery.  I have reviewed the patient's chart and labs.  Questions were answered to the patient's satisfaction.     Shelva Majestic

## 2021-11-23 NOTE — ED Notes (Signed)
Pt son Jenny Reichmann [phone] would like a call when pt gets a bed upstairs

## 2021-11-23 NOTE — ED Provider Notes (Signed)
[facility] EMERGENCY DEPARTMENT Provider Note   CSN: [id number] Arrival date & time: 11/22/21  1515     History  Chief Complaint  Patient presents with   Chest Pain   Shortness of Breath    Felicia Bryant is a 66 y.o. female.  HPI     This is a 66 year old female with a history of asthma and hyperlipidemia who presents with chest pain.  Patient reports over the last 1 to 2 weeks she has had progressively worsening dyspnea as well as chest tightness.  It started off with waking up 1 morning and feeling a tightness in her chest.  She trialed her albuterol with minimal relief.  She states that the tightness felt different than her asthma.  Over the course of 7 to 10 days she has had progressively worsening chest discomfort that radiates into her lower neck.  She also reports worsening dyspnea on exertion.  She has not had any cough or fevers.  She states that now she can barely across her room without having symptoms.  Patient was seen and evaluated in the cardiology office today.  She saw the cardiology PA.  Plan was for stress testing versus coronary artery CT.  Patient initially was scheduled for Myoview but had concerns regarding radiation and this was changed to a coronary CT.  She would require pretreatment as she has a contrast allergy.  Patient had ongoing symptoms at home and thus was brought to the emergency room by her significant other.  Currently she states she is having mild tightness.  Home Medications Prior to Admission medications   Medication Sig Start Date End Date Taking? Authorizing Provider  albuterol (VENTOLIN HFA) 108 (90 Base) MCG/ACT inhaler Inhale 1-2 puffs into the lungs as needed for wheezing or shortness of breath. 11/13/19   [provider]  Cholecalciferol (VITAMIN D PO) Take 1,000 Units by mouth daily.    [provider]  Methylcobalamin 1000 MCG SUBL Place 1,000 mcg under the tongue 4 (four) times a week.    [provider]  OVER THE COUNTER MEDICATION Take 14 drops by mouth daily. Algae based Omega 3    [provider]      Allergies    Benadryl [diphenhydramine hcl (sleep)], Haemophilus b polysaccharide vaccine, Promethazine, Vitamin d analogs, Ciprofloxacin, Codeine, Fluzone [influenza virus vaccine], Iodinated contrast media, Iodine, Levaquin [levofloxacin], Macrobid [nitrofurantoin macrocrystal], Percocet [oxycodone-acetaminophen], Amoxicillin, Bactrim [sulfamethoxazole-trimethoprim], Depo-medrol [methylprednisolone sodium succ], Keflex [cephalexin], and Metrogel [metronidazole]    Review of Systems   Review of Systems  Respiratory:  Positive for chest tightness and shortness of breath.   All other systems reviewed and are negative.   Physical Exam Updated Vital Signs BP 115/78 (BP Location: Left Arm)   Pulse 81   Temp 97.6 F (36.4 C) (Oral)   Resp 20   Ht 1.664 m (5' 5.5")   Wt 65.8 kg   SpO2 100%   BMI 23.76 kg/m  Physical Exam Vitals and nursing note reviewed.  Constitutional:      Appearance: She is well-developed. She is not ill-appearing.  HENT:     Head: Normocephalic and atraumatic.  Eyes:     Pupils: Pupils are equal, round, and reactive to light.  Cardiovascular:     Rate and Rhythm: Normal rate and regular rhythm.     Heart sounds: Normal heart sounds.  Pulmonary:     Effort: Pulmonary effort is normal. No respiratory distress.     Breath sounds: Normal breath  sounds. No wheezing.  Abdominal:     General: Bowel sounds are normal.     Palpations: Abdomen is soft.  Musculoskeletal:     Cervical back: Neck supple.  Skin:    General: Skin is warm and dry.  Neurological:     Mental Status: She is alert and oriented to person, place, and time.  Psychiatric:        Mood and Affect: Mood is anxious.     ED Results / Procedures / Treatments   Labs (all labs ordered are listed, but only abnormal results are displayed) Labs Reviewed  BASIC METABOLIC  PANEL - Abnormal; Notable for the following components:      Result Value   Sodium 134 (*)    CO2 20 (*)    Glucose, Bld 103 (*)    All other components within normal limits  CBC  HIV ANTIBODY (ROUTINE TESTING W REFLEX)  CBC  CREATININE, SERUM  TROPONIN I (HIGH SENSITIVITY)  TROPONIN I (HIGH SENSITIVITY)    EKG EKG Interpretation  Date/Time:  Tuesday November 22 2021 15:20:43 EDT Ventricular Rate:  82 PR Interval:  192 QRS Duration: 90 QT Interval:  350 QTC Calculation: 408 R Axis:   45 Text Interpretation: Normal sinus rhythm Normal ECG When compared with ECG of 13-Nov-2021 22:05, PREVIOUS ECG IS PRESENT Confirmed by Thayer Jew [phone]) on 11/22/2021 11:17:50 PM  Radiology No results found.  Procedures Procedures    Medications Ordered in ED Medications  aspirin tablet 325 mg (has no administration in time range)  enoxaparin (LOVENOX) injection 40 mg (has no administration in time range)  nitroGLYCERIN (NITROGLYN) 2 % ointment 1 inch (1 inch Topical Given 11/23/21 0032)    ED Course/ Medical Decision Making/ A&P Clinical Course as of 11/23/21 0051  Wed Nov 23, 2021  0051 Spoke to cardiology who recommends admission.  He will see the see the patient and is going to likely recommend stress testing in the morning.  Patient is refusing chest x-ray. [CH]    Clinical Course User Index [CH] Ivyanna Sibert, Barbette Hair, MD                           Medical Decision Making Risk OTC drugs. Prescription drug management. Decision regarding hospitalization.   This patient presents to the ED for concern of exertional chest discomfort, this involves an extensive number of treatment options, and is a complaint that carries with it a high risk of complications and morbidity.  I considered the following differential and admission for this acute, potentially life threatening condition.  The differential diagnosis includes ACS, PE, pneumothorax, pneumonia, CHF  MDM:    This is a  66 year old female who presents with concerns for exertional chest pain and shortness of breath.  She is overall nontoxic and vital signs are reassuring.  She has some questionable history of hyperlipidemia and former obesity but is otherwise fairly low risk for ACS.  Her EKG shows no evidence of acute ischemia and her initial troponin is negative.  However, her story is somewhat concerning given its exertional nature and description.  She is not wheezing on exam and I have low suspicion for asthma.  She is refusing chest x-ray; however, she has clear breath sounds in all quadrants so would have lower suspicion for pneumonia or pneumothorax.  I have reviewed her cardiology notes from yesterday.  Plan for cardiology consultation and admission for definitive stress or functional cardiac testing.  Patient was  given aspirin and Nitropaste was applied.  (Labs, imaging, consults)  Labs: I Ordered, and personally interpreted labs.  The pertinent results include: CBC, BMP, troponin  Imaging Studies ordered: I ordered imaging studies including refused chest x-ray I independently visualized and interpreted imaging. I agree with the radiologist interpretation  Additional history obtained from husband at bedside.  External records from outside source obtained and reviewed including cardiology notes  Cardiac Monitoring: The patient was maintained on a cardiac monitor.  I personally viewed and interpreted the cardiac monitored which showed an underlying rhythm of: Sinus rhythm  Reevaluation: After the interventions noted above, I reevaluated the patient and found that they have :stayed the same  Social Determinants of Health: Lives independently  Disposition: Admit  Co morbidities that complicate the patient evaluation  Past Medical History:  Diagnosis Date   Asthma    Complication of anesthesia    pt states i have had trouble with my throat closing after i had a tube down my throat before."    Palpitations    seen Dr.Skains in 2009..holter with pac's   Palpitations    echocardiogram in 3833 with diastolic dysfunction EF 57 %   Seizures ([place])    had seizure as child from vaccines and has had not seizures since was a child'.     Medicines Meds ordered this encounter  Medications   aspirin tablet 325 mg   nitroGLYCERIN (NITROGLYN) 2 % ointment 1 inch   enoxaparin (LOVENOX) injection 40 mg    I have reviewed the patients home medicines and have made adjustments as needed  Problem List / ED Course: Problem List Items Addressed This Visit       Other   * (Principal) Chest pain - Primary                Final Clinical Impression(s) / ED Diagnoses Final diagnoses:  Precordial pain    Rx / DC Orders ED Discharge Orders     None         Merryl Hacker, MD 11/23/21 [phone]

## 2021-11-23 NOTE — ED Notes (Signed)
Pt ambulated to restroom without assistance at this time ?

## 2021-11-23 NOTE — ED Provider Notes (Incomplete)
[facility] EMERGENCY DEPARTMENT Provider Note   CSN: [id number] Arrival date & time: 11/22/21  1515     History {Add pertinent medical, surgical, social history, OB history to HPI:1} Chief Complaint  Patient presents with  . Chest Pain  . Shortness of Breath    Felicia Bryant is a 66 y.o. female.  HPI     Home Medications Prior to Admission medications   Medication Sig Start Date End Date Taking? Authorizing Provider  albuterol (VENTOLIN HFA) 108 (90 Base) MCG/ACT inhaler Inhale 1-2 puffs into the lungs as needed for wheezing or shortness of breath. 11/13/19   [provider]  Cholecalciferol (VITAMIN D PO) Take 1,000 Units by mouth daily.    [provider]  Methylcobalamin 1000 MCG SUBL Place 1,000 mcg under the tongue 4 (four) times a week.    [provider]  OVER THE COUNTER MEDICATION Take 14 drops by mouth daily. Algae based Omega 3    [provider]      Allergies    Benadryl [diphenhydramine hcl (sleep)], Haemophilus b polysaccharide vaccine, Promethazine, Vitamin d analogs, Ciprofloxacin, Codeine, Depo-medrol [methylprednisolone sodium succ], Fluzone [influenza virus vaccine], Iodinated contrast media, Iodine, Levaquin [levofloxacin], Macrobid [nitrofurantoin macrocrystal], Percocet [oxycodone-acetaminophen], Amoxicillin, Bactrim [sulfamethoxazole-trimethoprim], Keflex [cephalexin], and Metrogel [metronidazole]    Review of Systems   Review of Systems  Physical Exam Updated Vital Signs BP 115/78 (BP Location: Left Arm)   Pulse 81   Temp 97.6 F (36.4 C) (Oral)   Resp 20   Ht 1.664 m (5' 5.5")   Wt 65.8 kg   SpO2 100%   BMI 23.76 kg/m  Physical Exam  ED Results / Procedures / Treatments   Labs (all labs ordered are listed, but only abnormal results are displayed) Labs Reviewed  BASIC METABOLIC PANEL - Abnormal; Notable for the following components:      Result Value   Sodium 134 (*)    CO2 20 (*)     Glucose, Bld 103 (*)    All other components within normal limits  CBC  TROPONIN I (HIGH SENSITIVITY)  TROPONIN I (HIGH SENSITIVITY)    EKG EKG Interpretation  Date/Time:  Tuesday November 22 2021 15:20:43 EDT Ventricular Rate:  82 PR Interval:  192 QRS Duration: 90 QT Interval:  350 QTC Calculation: 408 R Axis:   45 Text Interpretation: Normal sinus rhythm Normal ECG When compared with ECG of 13-Nov-2021 22:05, PREVIOUS ECG IS PRESENT Confirmed by Thayer Jew [phone]) on 11/22/2021 11:17:50 PM  Radiology No results found.  Procedures Procedures  {Document cardiac monitor, telemetry assessment procedure when appropriate:1}  Medications Ordered in ED Medications  aspirin tablet 325 mg (has no administration in time range)  nitroGLYCERIN (NITROGLYN) 2 % ointment 1 inch (has no administration in time range)    ED Course/ Medical Decision Making/ A&P                           Medical Decision Making Risk OTC drugs. Prescription drug management.   ***  {Document critical care time when appropriate:1} {Document review of labs and clinical decision tools ie heart score, Chads2Vasc2 etc:1}  {Document your independent review of radiology images, and any outside records:1} {Document your discussion with family members, caretakers, and with consultants:1} {Document social determinants of health affecting pt's care:1} {Document your decision making why or why not admission, treatments were needed:1} Final Clinical Impression(s) / ED Diagnoses Final diagnoses:  None  Rx / DC Orders ED Discharge Orders     None

## 2021-11-23 NOTE — ED Notes (Signed)
Received verbal report from Beth at this time

## 2021-11-23 NOTE — Progress Notes (Signed)
Cardiac cath findings reviewed: Conclusion      Prox LAD to Mid LAD lesion is 5% stenosed.   The left ventricular ejection fraction is 55-65% by visual estimate.   No significant coronary obstructive disease with minimal 5% plaque in the proximal LAD.   Normal small ramus intermediate, left circumflex and right dominant RCA.   Normal LV function with EF estimated 55 to 60%.  LVEDP 7 mm.   RECOMMENDATION: Suspect nonischemic chest pain.  Consider possible pulmonary evaluation for shortness of breath.  Recommend checking a ddimer and if negative would recommend outpt pulmonary eval.    Will sign off.  Call with any questions.

## 2021-11-23 NOTE — Progress Notes (Addendum)
Rounding Note    Patient Name: Felicia Bryant Date of Encounter: 11/23/2021  [place] Cardiologist: Candee Furbish, MD   Subjective   Still with SOB with ambulation and fullness pressure comes up into chest and neck.  And other times chest pain alone  Inpatient Medications    Scheduled Meds:  aspirin  325 mg Oral Daily   enoxaparin (LOVENOX) injection  40 mg Subcutaneous Q24H   Continuous Infusions:   PRN Meds: acetaminophen, melatonin, ondansetron (ZOFRAN) IV, polyethylene glycol   Vital Signs    Vitals:   11/23/21 0600 11/23/21 0700 11/23/21 0854 11/23/21 1000  BP: 119/68 112/69  114/73  Pulse: 79 76  84  Resp: '15 16  20  '$ Temp:   98.1 F (36.7 C)   TempSrc:   Oral   SpO2: 100% 98%  100%  Weight:      Height:       No intake or output data in the 24 hours ending 11/23/21 1150    11/22/2021    3:20 PM 11/22/2021    8:32 AM 04/12/2020   11:38 AM  Last 3 Weights  Weight (lbs) 145 lb 145 lb 6.4 oz 153 lb  Weight (kg) 65.772 kg 65.953 kg 69.4 kg      Telemetry    SR - Personally Reviewed  ECG    No new - Personally Reviewed  Physical Exam   GEN: No acute distress.   Neck: No JVD Cardiac: RRR, no murmurs, rubs, or gallops. No chest wall pain to palpatin Respiratory: Clear to auscultation bilaterally. GI: Soft, nontender, non-distended  MS: No edema; No deformity. Neuro:  Nonfocal  Psych: Normal affect   Labs    High Sensitivity Troponin:   Recent Labs  Lab 11/13/21 2242 11/22/21 1526 11/23/21 0043  TROPONINIHS '3 3 3     '$ Chemistry Recent Labs  Lab 11/22/21 1526 11/23/21 0122  NA 134*  --   K 3.8  --   CL 102  --   CO2 20*  --   GLUCOSE 103*  --   BUN 11  --   CREATININE 0.74 0.74  CALCIUM 9.9  --   GFRNONAA >60 >60  ANIONGAP 12  --     Lipids  Recent Labs  Lab 11/23/21 0122  CHOL 190  TRIG 40  HDL 80  LDLCALC 102*  CHOLHDL 2.4    Hematology Recent Labs  Lab 11/22/21 1526 11/23/21 0122  WBC 7.9 7.3  RBC 4.43  4.31  HGB 13.9 13.5  HCT 39.3 39.0  MCV 88.7 90.5  MCH 31.4 31.3  MCHC 35.4 34.6  RDW 11.8 11.9  PLT 266 262   Thyroid No results for input(s): "TSH", "FREET4" in the last 168 hours.  BNPNo results for input(s): "BNP", "PROBNP" in the last 168 hours.  DDimer No results for input(s): "DDIMER" in the last 168 hours.   Radiology    No results found.  Cardiac Studies   Echo 04/05/20 IMPRESSIONS     1. Left ventricular ejection fraction, by estimation, is 60 to 65%. The left ventricle has normal function. The left ventricle has no regional wall motion abnormalities. Left ventricular diastolic parameters are indeterminate. 2. Right ventricular systolic function is normal. The right ventricular size is normal. There is normal pulmonary artery systolic pressure. The estimated right ventricular systolic pressure is 86.5 mmHg. 3. The mitral valve is normal in structure. Mild mitral valve regurgitation. No evidence of mitral stenosis. 4. The aortic valve is tricuspid.  Aortic valve regurgitation is not visualized. Mild aortic valve sclerosis is present, with no evidence of aortic valve stenosis. 5. The inferior vena cava is normal in size with greater than 50% respiratory variability, suggesting right atrial pressure of 3 mmHg.   FINDINGS Left Ventricle: Left ventricular ejection fraction, by estimation, is 60 to 65%. The left ventricle has normal function. The left ventricle has no regional wall motion abnormalities. The left ventricular internal cavity size was normal in size. There is no left ventricular hypertrophy. Left ventricular diastolic parameters are indeterminate. Normal left ventricular filling pressure.   Right Ventricle: The right ventricular size is normal. No increase in right ventricular wall thickness. Right ventricular systolic function is normal. There is normal pulmonary artery systolic pressure. The tricuspid regurgitant velocity is 1.99 m/s, and with an assumed right  atrial pressure of 3 mmHg, the estimated right ventricular systolic pressure is 29.5 mmHg.   Left Atrium: Left atrial size was normal in size.   Right Atrium: Right atrial size was normal in size.   Pericardium: There is no evidence of pericardial effusion.   Mitral Valve: The mitral valve is normal in structure. Mild mitral annular calcification. Mild mitral valve regurgitation. No evidence of mitral valve stenosis.   Tricuspid Valve: The tricuspid valve is normal in structure. Tricuspid valve regurgitation is trivial. No evidence of tricuspid stenosis.   Aortic Valve: The aortic valve is tricuspid. Aortic valve regurgitation is not visualized. Mild aortic valve sclerosis is present, with no evidence of aortic valve stenosis.   Pulmonic Valve: The pulmonic valve was normal in structure. Pulmonic valve regurgitation is not visualized. No evidence of pulmonic stenosis.   Aorta: The aortic root is normal in size and structure.   Venous: The inferior vena cava is normal in size with greater than 50% respiratory variability, suggesting right atrial pressure of 3 mmHg.   IAS/Shunts: No atrial level shunt detected by color flow Doppler.   Patient Profile     66 y.o. female  hx of asthma, palpitations following epinephrine for dental procedure and uterine cancer now presents with chest pain, scheduled for stress test Friday after office visit presented to ER for symptom progression.    Assessment & Plan    # Chest Pain # DOE   She has been having symptoms since 10-12 days. No ischemic changes on EKG Troponin flat and negative However considering symptoms appropriate to get nuclear regadenoson stress test (no active asthma currently) vs cardiac CTA or cath Dr. Radford Pax to see -- on office visit she did not want contrast (allergy to contrast)  Echo pending Aspirin 81 mg  Lipid panel with LDL 102, HDL 80 and TG 40 this admit and HB A1c  Telemetry TSH 2.996  Dr. Radford Pax discussed cath  with pt and her contrast allergy.  We would premedicate  The patient understands that risks included but are not limited to stroke (1 in 1000), death (1 in 10), kidney failure [usually temporary] (1 in 500), bleeding (1 in 200), allergic reaction [possibly serious] (1 in 200).         For questions or updates, please contact [facility] Please consult [email or web address] for contact info under        Signed, Cecilie Kicks, NP  11/23/2021, 11:50 AM

## 2021-11-23 NOTE — H&P (View-Only) (Signed)
Rounding Note    Patient Name: Felicia Bryant Date of Encounter: 11/23/2021  [place] Cardiologist: Candee Furbish, MD   Subjective   Still with SOB with ambulation and fullness pressure comes up into chest and neck.  And other times chest pain alone  Inpatient Medications    Scheduled Meds:  aspirin  325 mg Oral Daily   enoxaparin (LOVENOX) injection  40 mg Subcutaneous Q24H   Continuous Infusions:   PRN Meds: acetaminophen, melatonin, ondansetron (ZOFRAN) IV, polyethylene glycol   Vital Signs    Vitals:   11/23/21 0600 11/23/21 0700 11/23/21 0854 11/23/21 1000  BP: 119/68 112/69  114/73  Pulse: 79 76  84  Resp: '15 16  20  '$ Temp:   98.1 F (36.7 C)   TempSrc:   Oral   SpO2: 100% 98%  100%  Weight:      Height:       No intake or output data in the 24 hours ending 11/23/21 1150    11/22/2021    3:20 PM 11/22/2021    8:32 AM 04/12/2020   11:38 AM  Last 3 Weights  Weight (lbs) 145 lb 145 lb 6.4 oz 153 lb  Weight (kg) 65.772 kg 65.953 kg 69.4 kg      Telemetry    SR - Personally Reviewed  ECG    No new - Personally Reviewed  Physical Exam   GEN: No acute distress.   Neck: No JVD Cardiac: RRR, no murmurs, rubs, or gallops. No chest wall pain to palpatin Respiratory: Clear to auscultation bilaterally. GI: Soft, nontender, non-distended  MS: No edema; No deformity. Neuro:  Nonfocal  Psych: Normal affect   Labs    High Sensitivity Troponin:   Recent Labs  Lab 11/13/21 2242 11/22/21 1526 11/23/21 0043  TROPONINIHS '3 3 3     '$ Chemistry Recent Labs  Lab 11/22/21 1526 11/23/21 0122  NA 134*  --   K 3.8  --   CL 102  --   CO2 20*  --   GLUCOSE 103*  --   BUN 11  --   CREATININE 0.74 0.74  CALCIUM 9.9  --   GFRNONAA >60 >60  ANIONGAP 12  --     Lipids  Recent Labs  Lab 11/23/21 0122  CHOL 190  TRIG 40  HDL 80  LDLCALC 102*  CHOLHDL 2.4    Hematology Recent Labs  Lab 11/22/21 1526 11/23/21 0122  WBC 7.9 7.3  RBC 4.43  4.31  HGB 13.9 13.5  HCT 39.3 39.0  MCV 88.7 90.5  MCH 31.4 31.3  MCHC 35.4 34.6  RDW 11.8 11.9  PLT 266 262   Thyroid No results for input(s): "TSH", "FREET4" in the last 168 hours.  BNPNo results for input(s): "BNP", "PROBNP" in the last 168 hours.  DDimer No results for input(s): "DDIMER" in the last 168 hours.   Radiology    No results found.  Cardiac Studies   Echo 04/05/20 IMPRESSIONS     1. Left ventricular ejection fraction, by estimation, is 60 to 65%. The left ventricle has normal function. The left ventricle has no regional wall motion abnormalities. Left ventricular diastolic parameters are indeterminate. 2. Right ventricular systolic function is normal. The right ventricular size is normal. There is normal pulmonary artery systolic pressure. The estimated right ventricular systolic pressure is 40.0 mmHg. 3. The mitral valve is normal in structure. Mild mitral valve regurgitation. No evidence of mitral stenosis. 4. The aortic valve is tricuspid.  Aortic valve regurgitation is not visualized. Mild aortic valve sclerosis is present, with no evidence of aortic valve stenosis. 5. The inferior vena cava is normal in size with greater than 50% respiratory variability, suggesting right atrial pressure of 3 mmHg.   FINDINGS Left Ventricle: Left ventricular ejection fraction, by estimation, is 60 to 65%. The left ventricle has normal function. The left ventricle has no regional wall motion abnormalities. The left ventricular internal cavity size was normal in size. There is no left ventricular hypertrophy. Left ventricular diastolic parameters are indeterminate. Normal left ventricular filling pressure.   Right Ventricle: The right ventricular size is normal. No increase in right ventricular wall thickness. Right ventricular systolic function is normal. There is normal pulmonary artery systolic pressure. The tricuspid regurgitant velocity is 1.99 m/s, and with an assumed right  atrial pressure of 3 mmHg, the estimated right ventricular systolic pressure is 67.5 mmHg.   Left Atrium: Left atrial size was normal in size.   Right Atrium: Right atrial size was normal in size.   Pericardium: There is no evidence of pericardial effusion.   Mitral Valve: The mitral valve is normal in structure. Mild mitral annular calcification. Mild mitral valve regurgitation. No evidence of mitral valve stenosis.   Tricuspid Valve: The tricuspid valve is normal in structure. Tricuspid valve regurgitation is trivial. No evidence of tricuspid stenosis.   Aortic Valve: The aortic valve is tricuspid. Aortic valve regurgitation is not visualized. Mild aortic valve sclerosis is present, with no evidence of aortic valve stenosis.   Pulmonic Valve: The pulmonic valve was normal in structure. Pulmonic valve regurgitation is not visualized. No evidence of pulmonic stenosis.   Aorta: The aortic root is normal in size and structure.   Venous: The inferior vena cava is normal in size with greater than 50% respiratory variability, suggesting right atrial pressure of 3 mmHg.   IAS/Shunts: No atrial level shunt detected by color flow Doppler.   Patient Profile     66 y.o. female  hx of asthma, palpitations following epinephrine for dental procedure and uterine cancer now presents with chest pain, scheduled for stress test Friday after office visit presented to ER for symptom progression.    Assessment & Plan    # Chest Pain # DOE   She has been having symptoms since 10-12 days. No ischemic changes on EKG Troponin flat and negative However considering symptoms appropriate to get nuclear regadenoson stress test (no active asthma currently) vs cardiac CTA or cath Dr. Radford Pax to see -- on office visit she did not want contrast (allergy to contrast)  Echo pending Aspirin 81 mg  Lipid panel with LDL 102, HDL 80 and TG 40 this admit and HB A1c  Telemetry TSH 2.996  Dr. Radford Pax discussed cath  with pt and her contrast allergy.  We would premedicate  The patient understands that risks included but are not limited to stroke (1 in 1000), death (1 in 18), kidney failure [usually temporary] (1 in 500), bleeding (1 in 200), allergic reaction [possibly serious] (1 in 200).         For questions or updates, please contact [place] Please consult [email or web address] for contact info under        Signed, Cecilie Kicks, NP  11/23/2021, 11:50 AM

## 2021-11-23 NOTE — Consult Note (Signed)
Cardiology Consultation   Patient ID: Felicia Bryant MRN: [id number]; DOB: Feb 22, 1955  Admit date: 11/22/2021 Date of Consult: 11/23/2021  PCP:  Sharilyn Sites, MD   [facility] Cardiologist:  Candee Furbish, MD        Patient Profile:   Felicia Bryant is a 66 y.o. female with a hx of asthma, palpitations and uterine cancer who is being seen 11/23/2021 for the evaluation of chest pain at the request of Dr. Nevada Crane  History of Present Illness:   Felicia Bryant is a 66 y.o. female with a hx of asthma, palpitations and uterine cancer who is being seen 11/23/2021 for the evaluation of chest pain at the request of Dr. Nevada Crane. She was seen in cardiology clinic today and was supposed to get outpatient stress test for her exertional CP and SOB which has been happening. However due to symptoms progression, she did not want to wait till Friday and came to the ED. In the ED, high-sensitivity troponin negative x1.  No evidence of acute ischemia on 12-lead EKG. Labs unremarkable, VS stable. Currently no chest pain. However with exertion, she gets it. All of this started since 2 weeks. She is anxious. She had questions about whether CCTA vs stress test.    Past Medical History:  Diagnosis Date   Asthma    Complication of anesthesia    pt states i have had trouble with my throat closing after i had a tube down my throat before."   Palpitations    seen Dr.Skains in 2009..holter with pac's   Palpitations    echocardiogram in 9030 with diastolic dysfunction EF 57 %   Seizures ([place])    had seizure as child from vaccines and has had not seizures since was a child'.    Past Surgical History:  Procedure Laterality Date   CATARACT EXTRACTION W/PHACO Right 11/30/2014   Procedure: CATARACT EXTRACTION PHACO AND INTRAOCULAR LENS PLACEMENT (IOC);  Surgeon: Tonny Branch, MD;  Location: [facility];  Service: Ophthalmology;  Laterality: Right;  CDE:10.59   CATARACT EXTRACTION W/PHACO Left 03/04/2018   Procedure:  CATARACT EXTRACTION PHACO AND INTRAOCULAR LENS PLACEMENT (IOC);  Surgeon: Tonny Branch, MD;  Location: [facility];  Service: Ophthalmology;  Laterality: Left;  CDE: 8.40   CHOLECYSTECTOMY     conization of cervix     DILATION AND CURETTAGE OF UTERUS     episiotomy repair also.   TONSILLECTOMY         Inpatient Medications: Scheduled Meds:  aspirin  325 mg Oral Daily   enoxaparin (LOVENOX) injection  40 mg Subcutaneous Q24H   Continuous Infusions:  sodium chloride     PRN Meds: acetaminophen, melatonin, ondansetron (ZOFRAN) IV, polyethylene glycol  Allergies:    Allergies  Allergen Reactions   Benadryl [Diphenhydramine Hcl (Sleep)] Shortness Of Breath    iv   Haemophilus B Polysaccharide Vaccine     Other reaction(s): Other (See Comments) seizures   Promethazine Nausea And Vomiting   Vitamin D Analogs Nausea Only    If given in a high dose   Ciprofloxacin Other (See Comments)    Pain in stomach   Codeine Other (See Comments)    Pt reports "It makes me having panting breathing and my feet and hands go numb"   Fluzone [Influenza Virus Vaccine] Other (See Comments)    Heart Racing/Passed out.   Iodinated Contrast Media Other (See Comments)    heart    Iodine Nausea Only   Levaquin [Levofloxacin] Other (See Comments)  Heart rate 120's Tendon pops   Macrobid [Nitrofurantoin Macrocrystal] Nausea And Vomiting   Percocet [Oxycodone-Acetaminophen] Other (See Comments)    Pt reports "It makes me having panting breathing and my feet and hands go numb"   Amoxicillin Rash   Bactrim [Sulfamethoxazole-Trimethoprim] Rash   Depo-Medrol [Methylprednisolone Sodium Succ] Palpitations    Racing heart, and flushing   Keflex [Cephalexin] Rash   Metrogel [Metronidazole] Palpitations    Social History:   Social History   Socioeconomic History   Marital status: Married    Spouse name: Not on file   Number of children: Not on file   Years of education: Not on file   Highest education  level: Not on file  Occupational History   Not on file  Tobacco Use   Smoking status: Never   Smokeless tobacco: Never  Vaping Use   Vaping Use: Never used  Substance and Sexual Activity   Alcohol use: No   Drug use: No   Sexual activity: Yes    Birth control/protection: None, Post-menopausal  Other Topics Concern   Not on file  Social History Narrative   Not on file   Social Determinants of Health   Financial Resource Strain: Not on file  Food Insecurity: Not on file  Transportation Needs: Not on file  Physical Activity: Not on file  Stress: Not on file  Social Connections: Not on file  Intimate Partner Violence: Not on file    Family History:    Family History  Problem Relation Age of Onset   Dementia Mother    Heart attack Father    Stroke Neg Hx      ROS:  Please see the history of present illness.  All other ROS reviewed and negative.     Physical Exam/Data:   Vitals:   11/22/21 1520 11/22/21 1958 11/23/21 0040 11/23/21 0300  BP: 128/73 115/78 120/70   Pulse: 81 81 79 77  Resp: '16 20 17 14  '$ Temp: 98.1 F (36.7 C) 97.6 F (36.4 C)    TempSrc: Oral Oral    SpO2: 100% 100% 100% 98%  Weight:      Height:       No intake or output data in the 24 hours ending 11/23/21 0434    11/22/2021    3:20 PM 11/22/2021    8:32 AM 04/12/2020   11:38 AM  Last 3 Weights  Weight (lbs) 145 lb 145 lb 6.4 oz 153 lb  Weight (kg) 65.772 kg 65.953 kg 69.4 kg     Body mass index is 23.76 kg/m.  General:  Well nourished, well developed, in no acute distress HEENT: normal Neck: no JVD Vascular: No carotid bruits; Distal pulses 2+ bilaterally Cardiac:  normal S1, S2; RRR; no murmur  Lungs:  clear to auscultation bilaterally, no wheezing, rhonchi or rales  Abd: soft, nontender, no hepatomegaly  Ext: no edema Musculoskeletal:  No deformities, BUE and BLE strength normal and equal Skin: warm and dry  Neuro:  CNs 2-12 intact, no focal abnormalities noted Psych:  Normal  affect   EKG:  The EKG was personally reviewed and demonstrates:  no ischemic changes  Relevant CV Studies:  Echo 2022:  Left ventricular ejection fraction, by estimation, is 60 to 65%. The  left ventricle has normal function. The left ventricle has no regional  wall motion abnormalities. Left ventricular diastolic parameters are  indeterminate.   2. Right ventricular systolic function is normal. The right ventricular  size is normal. There is  normal pulmonary artery systolic pressure. The  estimated right ventricular systolic pressure is 32.9 mmHg.   3. The mitral valve is normal in structure. Mild mitral valve  regurgitation. No evidence of mitral stenosis.   4. The aortic valve is tricuspid. Aortic valve regurgitation is not  visualized. Mild aortic valve sclerosis is present, with no evidence of  aortic valve stenosis.   5. The inferior vena cava is normal in size with greater than 50%  respiratory variability, suggesting right atrial pressure of 3 mmHg.   Laboratory Data:  High Sensitivity Troponin:   Recent Labs  Lab 11/13/21 2242 11/22/21 1526 11/23/21 0043  TROPONINIHS '3 3 3     '$ Chemistry Recent Labs  Lab 11/22/21 1526 11/23/21 0122  NA 134*  --   K 3.8  --   CL 102  --   CO2 20*  --   GLUCOSE 103*  --   BUN 11  --   CREATININE 0.74 0.74  CALCIUM 9.9  --   GFRNONAA >60 >60  ANIONGAP 12  --     No results for input(s): "PROT", "ALBUMIN", "AST", "ALT", "ALKPHOS", "BILITOT" in the last 168 hours. Lipids  Recent Labs  Lab 11/23/21 0122  CHOL 190  TRIG 40  HDL 80  LDLCALC 102*  CHOLHDL 2.4    Hematology Recent Labs  Lab 11/22/21 1526 11/23/21 0122  WBC 7.9 7.3  RBC 4.43 4.31  HGB 13.9 13.5  HCT 39.3 39.0  MCV 88.7 90.5  MCH 31.4 31.3  MCHC 35.4 34.6  RDW 11.8 11.9  PLT 266 262   Thyroid No results for input(s): "TSH", "FREET4" in the last 168 hours.  BNPNo results for input(s): "BNP", "PROBNP" in the last 168 hours.  DDimer No results  for input(s): "DDIMER" in the last 168 hours.   Radiology/Studies:  No results found.   Assessment and Plan:   # Chest Pain # DOE  She has been having symptoms since 10-12 days. No ischemic changes on EKG Troponin flat However considering symptoms appropriate to get nuclear regadenoson stress test (no active asthma currently) Aspirin 81 mg Get Lipid panel and HB A1c Telemetry  For questions or updates, please contact [place] Please consult [email or web address] for contact info under    Signed, Jaci Lazier, MD  11/23/2021 4:34 AM

## 2021-11-23 NOTE — Progress Notes (Addendum)
Patient seen and examined, admitted earlier this morning by Dr. Francia Greaves, please see her H&P for details briefly Felicia Bryant is a 65/F with history of asthma and possible aortic sclerosis presented to the ED with ongoing intermittent chest pain/squeezing pressure and dyspnea on exertion for 10 to 14 days, was seen in the ED last week for same. -Vitals were stable, at bedtime troponin was negative, EKG without acute findings  Assessment and plan  Substernal chest pain Dyspnea on exertion -EKG unremarkable and at bedtime troponins are negative -Cardiology consulting -History of allergy to contrast dye, will need pretreatment with steroid protocol if coronary CTA is considered -Follow-up echo, check a.m. lipids and HbA1c -Anticipate need for ischemic evaluation  Domenic Polite, MD

## 2021-11-24 ENCOUNTER — Encounter (HOSPITAL_COMMUNITY): Payer: Self-pay | Admitting: Cardiovascular Disease

## 2021-11-24 DIAGNOSIS — R0609 Other forms of dyspnea: Secondary | ICD-10-CM | POA: Diagnosis not present

## 2021-11-24 DIAGNOSIS — R079 Chest pain, unspecified: Secondary | ICD-10-CM | POA: Diagnosis not present

## 2021-11-24 LAB — BASIC METABOLIC PANEL
Anion gap: 9 (ref 5–15)
BUN: 11 mg/dL (ref 8–23)
CO2: 17 mmol/L — ABNORMAL LOW (ref 22–32)
Calcium: 9.2 mg/dL (ref 8.9–10.3)
Chloride: 107 mmol/L (ref 98–111)
Creatinine, Ser: 0.69 mg/dL (ref 0.44–1.00)
GFR, Estimated: >60 mL/min (ref >60)
Glucose, Bld: 150 mg/dL — ABNORMAL HIGH (ref 70–99)
Potassium: 3.9 mmol/L (ref 3.5–5.1)
Sodium: 133 mmol/L — ABNORMAL LOW (ref 135–145)

## 2021-11-24 LAB — CBC
HCT: 38 % (ref 36.0–46.0)
Hemoglobin: 13.1 g/dL (ref 12.0–15.0)
MCH: 30.9 pg (ref 26.0–34.0)
MCHC: 34.5 g/dL (ref 30.0–36.0)
MCV: 89.6 fL (ref 80.0–100.0)
Platelets: 248 10*3/uL (ref 150–400)
RBC: 4.24 MIL/uL (ref 3.87–5.11)
RDW: 12 % (ref 11.5–15.5)
WBC: 7.7 10*3/uL (ref 4.0–10.5)
nRBC: 0 % (ref 0.0–0.2)

## 2021-11-24 LAB — LIPID PANEL
Cholesterol: 190 mg/dL (ref 0–200)
HDL: 83 mg/dL (ref >40)
LDL Cholesterol: 103 mg/dL — ABNORMAL HIGH (ref 0–99)
Total CHOL/HDL Ratio: 2.3 RATIO
Triglycerides: 20 mg/dL (ref <150)
VLDL: 4 mg/dL (ref 0–40)

## 2021-11-24 LAB — HEMOGLOBIN A1C
Hgb A1c MFr Bld: 5.2 % (ref 4.8–5.6)
Mean Plasma Glucose: 102.54 mg/dL

## 2021-11-24 MED ORDER — ASPIRIN 81 MG PO CHEW: 81.0000 mg | CHEWABLE_TABLET | Freq: Every day | ORAL | 0 refills | Status: DC

## 2021-11-24 NOTE — Discharge Summary (Signed)
Physician Discharge Summary  Felicia Bryant ZOX:[id number] DOB: 10/23/1955 DOA: 11/22/2021  PCP: Sharilyn Sites, MD  Admit date: 11/22/2021 Discharge date: 11/24/2021  Time spent: 35 minutes  Recommendations for Outpatient Follow-up:  Cardiology Dr. Marlou Porch 10/20 Pulmonary in 2 weeks   Discharge Diagnoses:  Principal Problem:   Chest pain Active Problems:   Crescendo angina ([place])   Pure hypercholesterolemia   Exertional dyspnea   Discharge Condition: Stable  Diet recommendation: Low-sodium, heart healthy  Filed Weights   11/22/21 1520 11/24/21 0514  Weight: 65.8 kg 67.3 kg    History of present illness:  65/F with history of asthma and possible aortic sclerosis presented to the ED with ongoing intermittent chest pain/squeezing pressure and dyspnea on exertion for 10 to 14 days, was seen in the ED last week for same. -Vitals were stable, at bedtime troponin was negative, EKG without acute findings  Hospital Course:   Substernal chest pain Dyspnea on exertion -EKG unremarkable and at bedtime troponins are negative -Cardiology consulted, underwent cardiac cath yesterday which showed only 5% plaque in mid LAD otherwise essentially normal coronaries, echo noted preserved EF, grade 1 diastolic dysfunction, trivial pericardial effusion. -Patient was concerned that her pericardial effusion was causing her chest pain and dyspnea on exertion, we felt this was highly unlikely and unable to explain her dyspnea on exertion. -D-dimer was also obtained, this was normal -Seen by cardiology as well, recommended to continue aspirin 81 mg daily, follow-up with pulmonary, she was asked to reschedule her appointment with pulmonary service  Procedures: 2D echo 11/23/2021 IMPRESSIONS    1. Left ventricular ejection fraction, by estimation, is 60 to 65%. The  left ventricle has normal function. The left ventricle has no regional  wall motion abnormalities. Left ventricular diastolic parameters are   consistent with Grade I diastolic  dysfunction (impaired relaxation).   2. Right ventricular systolic function is normal. The right ventricular  size is normal. There is normal pulmonary artery systolic pressure. The  estimated right ventricular systolic pressure is 81.1 mmHg.   3. The mitral valve is normal in structure. No evidence of mitral valve  regurgitation. No evidence of mitral stenosis.   4. The aortic valve is tricuspid. Aortic valve regurgitation is not  visualized. No aortic stenosis is present.   5. The inferior vena cava is normal in size with greater than 50%  respiratory variability, suggesting right atrial pressure of 3 mmHg.    Cardiac cath 11/23/2021  Conclusion     Prox LAD to Mid LAD lesion is 5% stenosed.   The left ventricular ejection fraction is 55-65% by visual estimate.   No significant coronary obstructive disease with minimal 5% plaque in the proximal LAD.   Normal small ramus intermediate, left circumflex and right dominant RCA.   Normal LV function with EF estimated 55 to 60%.  LVEDP 7 mm.   RECOMMENDATION: Suspect nonischemic chest pain.  Consider possible pulmonary evaluation for shortness of breath.   Consultations: Cards  Discharge Exam: Vitals:   11/23/21 2124 11/24/21 0514  BP: 117/69 123/75  Pulse: (!) 102 81  Resp: 18 16  Temp: 98.5 F (36.9 C) 97.7 F (36.5 C)  SpO2: 98% 100%     Discharge Instructions   Discharge Instructions     Diet - low sodium heart healthy   Complete by: As directed    Increase activity slowly   Complete by: As directed       Allergies as of 11/24/2021  Reactions   Benadryl [diphenhydramine Hcl (sleep)] Shortness Of Breath   iv   Haemophilus B Polysaccharide Vaccine    Other reaction(s): Other (See Comments) seizures   Promethazine Nausea And Vomiting   Vitamin D Analogs Nausea Only   If given in a high dose   Ciprofloxacin Other (See Comments)   Pain in stomach   Codeine Other  (See Comments)   Pt reports "It makes me having panting breathing and my feet and hands go numb"   Fluzone [influenza Virus Vaccine] Other (See Comments)   Heart Racing/Passed out.   Iodinated Contrast Media Other (See Comments)   Heart Palpatations  only   Iodine Nausea Only   Levaquin [levofloxacin] Other (See Comments)   Heart rate 120's Tendon pops   Macrobid [nitrofurantoin Macrocrystal] Nausea And Vomiting   Percocet [oxycodone-acetaminophen] Other (See Comments)   Pt reports "It makes me having panting breathing and my feet and hands go numb"   Amoxicillin Rash   Bactrim [sulfamethoxazole-trimethoprim] Rash   Depo-medrol [methylprednisolone Sodium Succ] Palpitations   Racing heart, and flushing   Keflex [cephalexin] Rash   Metrogel [metronidazole] Palpitations        Medication List     TAKE these medications    Albuterol Sulfate 2.5 MG/0.5ML Nebu Inhale 2.5 mg into the lungs 2 (two) times daily as needed (for shortness of breath or wheezing).   albuterol 108 (90 Base) MCG/ACT inhaler Commonly known as: VENTOLIN HFA Inhale 1-2 puffs into the lungs as needed for wheezing or shortness of breath.   aspirin 81 MG chewable tablet Chew 1 tablet (81 mg total) by mouth daily. Start taking on: November 25, 2021   Methylcobalamin 1000 MCG Subl Place 1,000 mcg under the tongue 4 (four) times a week.   OVER THE COUNTER MEDICATION Take 14 drops by mouth daily. Algae based Omega 3   VITAMIN D PO Take 2,000 Units by mouth daily.       Allergies  Allergen Reactions   Benadryl [Diphenhydramine Hcl (Sleep)] Shortness Of Breath    iv   Haemophilus B Polysaccharide Vaccine     Other reaction(s): Other (See Comments) seizures   Promethazine Nausea And Vomiting   Vitamin D Analogs Nausea Only    If given in a high dose   Ciprofloxacin Other (See Comments)    Pain in stomach   Codeine Other (See Comments)    Pt reports "It makes me having panting breathing and my feet  and hands go numb"   Fluzone [Influenza Virus Vaccine] Other (See Comments)    Heart Racing/Passed out.   Iodinated Contrast Media Other (See Comments)    Heart Palpatations  only   Iodine Nausea Only   Levaquin [Levofloxacin] Other (See Comments)    Heart rate 120's Tendon pops   Macrobid [Nitrofurantoin Macrocrystal] Nausea And Vomiting   Percocet [Oxycodone-Acetaminophen] Other (See Comments)    Pt reports "It makes me having panting breathing and my feet and hands go numb"   Amoxicillin Rash   Bactrim [Sulfamethoxazole-Trimethoprim] Rash   Depo-Medrol [Methylprednisolone Sodium Succ] Palpitations    Racing heart, and flushing   Keflex [Cephalexin] Rash   Metrogel [Metronidazole] Palpitations    Follow-up Information     Sharilyn Sites, MD. Schedule an appointment as soon as possible for a visit in 1 week(s).   Specialty: Family Medicine Contact information: [address] [place] [address] [phone]         Jerline Pain, MD Follow up on 12/09/2021.  Specialty: Cardiology Why: at 9:20 AM Contact information: [address] [place] [address] [phone]                  The results of significant diagnostics from this hospitalization (including imaging, microbiology, ancillary and laboratory) are listed below for reference.    Significant Diagnostic Studies: ECHOCARDIOGRAM COMPLETE  Result Date: 11/23/2021    ECHOCARDIOGRAM REPORT   Patient Name:   Felicia Bryant Date of Exam: 11/23/2021 Medical Rec #:  [id number]  Height:       65.5 in Accession #:    [id number] Weight:       145.0 lb Date of Birth:  Aug 03, 1955 BSA:          1.735 m Patient Age:    72 years   BP:           114/73 mmHg Patient Gender: F          HR:           84 bpm. Exam Location:  Inpatient Procedure: 2D Echo, Cardiac Doppler and Color Doppler Indications:    Chest pain  History:        Patient has prior history of Echocardiogram examinations, most                  recent 04/05/2021.  Sonographer:    Memory Argue Referring Phys: [id number] [place]  1. Left ventricular ejection fraction, by estimation, is 60 to 65%. The left ventricle has normal function. The left ventricle has no regional wall motion abnormalities. Left ventricular diastolic parameters are consistent with Grade I diastolic dysfunction (impaired relaxation).  2. Right ventricular systolic function is normal. The right ventricular size is normal. There is normal pulmonary artery systolic pressure. The estimated right ventricular systolic pressure is 46.5 mmHg.  3. The mitral valve is normal in structure. No evidence of mitral valve regurgitation. No evidence of mitral stenosis.  4. The aortic valve is tricuspid. Aortic valve regurgitation is not visualized. No aortic stenosis is present.  5. The inferior vena cava is normal in size with greater than 50% respiratory variability, suggesting right atrial pressure of 3 mmHg. FINDINGS  Left Ventricle: Left ventricular ejection fraction, by estimation, is 60 to 65%. The left ventricle has normal function. The left ventricle has no regional wall motion abnormalities. The left ventricular internal cavity size was normal in size. There is  no left ventricular hypertrophy. Left ventricular diastolic parameters are consistent with Grade I diastolic dysfunction (impaired relaxation). Right Ventricle: The right ventricular size is normal. No increase in right ventricular wall thickness. Right ventricular systolic function is normal. There is normal pulmonary artery systolic pressure. The tricuspid regurgitant velocity is 1.79 m/s, and  with an assumed right atrial pressure of 3 mmHg, the estimated right ventricular systolic pressure is 03.5 mmHg. Left Atrium: Left atrial size was normal in size. Right Atrium: Right atrial size was normal in size. Pericardium: Trivial pericardial effusion is present. Mitral Valve: The mitral valve is normal in structure.  There is mild calcification of the mitral valve leaflet(s). Mild mitral annular calcification. No evidence of mitral valve regurgitation. No evidence of mitral valve stenosis. Tricuspid Valve: The tricuspid valve is normal in structure. Tricuspid valve regurgitation is trivial. Aortic Valve: The aortic valve is tricuspid. Aortic valve regurgitation is not visualized. No aortic stenosis is present. Aortic valve mean gradient measures 3.0 mmHg. Aortic valve peak gradient measures 5.6 mmHg. Aortic valve area, by VTI  measures 2.57 cm. Pulmonic Valve: The pulmonic valve was normal in structure. Pulmonic valve regurgitation is not visualized. Aorta: The aortic root is normal in size and structure. Venous: The inferior vena cava is normal in size with greater than 50% respiratory variability, suggesting right atrial pressure of 3 mmHg. IAS/Shunts: No atrial level shunt detected by color flow Doppler.  LEFT VENTRICLE PLAX 2D LVIDd:         4.30 cm   Diastology LVIDs:         2.80 cm   LV e' medial:    8.08 cm/s LV PW:         0.80 cm   LV E/e' medial:  7.5 LV IVS:        0.90 cm   LV e' lateral:   12.00 cm/s LVOT diam:     2.00 cm   LV E/e' lateral: 5.0 LV SV:         52 LV SV Index:   30 LVOT Area:     3.14 cm  RIGHT VENTRICLE TAPSE (M-mode): 2.0 cm LEFT ATRIUM             Index        RIGHT ATRIUM           Index LA Vol (A2C):   30.5 ml 17.58 ml/m  RA Area:     10.60 cm LA Vol (A4C):   38.9 ml 22.42 ml/m  RA Volume:   21.80 ml  12.56 ml/m LA Biplane Vol: 36.3 ml 20.92 ml/m  AORTIC VALVE AV Area (Vmax):    2.72 cm AV Area (Vmean):   2.57 cm AV Area (VTI):     2.57 cm AV Vmax:           118.00 cm/s AV Vmean:          79.600 cm/s AV VTI:            0.203 m AV Peak Grad:      5.6 mmHg AV Mean Grad:      3.0 mmHg LVOT Vmax:         102.00 cm/s LVOT Vmean:        65.000 cm/s LVOT VTI:          0.166 m LVOT/AV VTI ratio: 0.82  AORTA Ao Root diam: 3.20 cm Ao Asc diam:  3.30 cm MITRAL VALVE               TRICUSPID VALVE  MV Area (PHT): 4.89 cm    TR Peak grad:   12.8 mmHg MV Decel Time: 155 msec    TR Vmax:        179.00 cm/s MV E velocity: 60.50 cm/s MV A velocity: 66.50 cm/s  SHUNTS MV E/A ratio:  0.91        Systemic VTI:  0.17 m                            Systemic Diam: 2.00 cm Dalton McleanMD Electronically signed by Franki Monte Signature Date/Time: 11/23/2021/3:17:45 PM    Final    CARDIAC CATHETERIZATION  Result Date: 11/23/2021   Prox LAD to Mid LAD lesion is 5% stenosed.   The left ventricular ejection fraction is 55-65% by visual estimate. No significant coronary obstructive disease with minimal 5% plaque in the proximal LAD. Normal small ramus intermediate, left circumflex and right dominant RCA. Normal LV function with EF estimated 55 to 60%.  LVEDP  7 mm. RECOMMENDATION: Suspect nonischemic chest pain.  Consider possible pulmonary evaluation for shortness of breath.  DG Chest 2 View  Result Date: 11/13/2021 CLINICAL DATA:  Worsening shortness of breath for 1 month.  Asthma. EXAM: CHEST - 2 VIEW COMPARISON:  02/12/2020 FINDINGS: The heart size and mediastinal contours are within normal limits. Aortic atherosclerotic calcification incidentally noted. Both lungs are clear. The visualized skeletal structures are unremarkable. IMPRESSION: No active cardiopulmonary disease. Electronically Signed   By: Marlaine Hind M.D.   On: 11/13/2021 12:01    Microbiology: No results found for this or any previous visit (from the past 240 hour(s)).   Labs: Basic Metabolic Panel: Recent Labs  Lab 11/22/21 1526 11/23/21 0043 11/23/21 0122 11/24/21 0127  NA 134*  --   --  133*  K 3.8  --   --  3.9  CL 102  --   --  107  CO2 20*  --   --  17*  GLUCOSE 103*  --   --  150*  BUN 11  --   --  11  CREATININE 0.74  --  0.74 0.69  CALCIUM 9.9  --   --  9.2  MG  --  2.0  --   --   PHOS  --  3.0  --   --    Liver Function Tests: No results for input(s): "AST", "ALT", "ALKPHOS", "BILITOT", "PROT", "ALBUMIN" in the last  168 hours. No results for input(s): "LIPASE", "AMYLASE" in the last 168 hours. No results for input(s): "AMMONIA" in the last 168 hours. CBC: Recent Labs  Lab 11/22/21 1526 11/23/21 0122 11/24/21 0127  WBC 7.9 7.3 7.7  HGB 13.9 13.5 13.1  HCT 39.3 39.0 38.0  MCV 88.7 90.5 89.6  PLT 266 262 248   Cardiac Enzymes: No results for input(s): "CKTOTAL", "CKMB", "CKMBINDEX", "TROPONINI" in the last 168 hours. BNP: BNP (last 3 results) No results for input(s): "BNP" in the last 8760 hours.  ProBNP (last 3 results) No results for input(s): "PROBNP" in the last 8760 hours.  CBG: No results for input(s): "GLUCAP" in the last 168 hours.     Signed:  Domenic Polite MD.  [facility] 11/24/2021, 10:50 AM

## 2021-11-24 NOTE — Progress Notes (Signed)
Patient refused am CBC lab draw.

## 2021-11-24 NOTE — Progress Notes (Signed)
Progress Note  Patient Name: Felicia Bryant Date of Encounter: 11/24/2021  [facility] Cardiologist: Candee Furbish, MD   Subjective   Admitted with crescendo angina and underwent cardiac cath yesterday showing 5% proximal to mid LAD and normal LV function with EF 55 to 65%.  Felt to have noncardiac chest pain.  D-dimer was normal we signed off yesterday but patient concerned that her trivial pericardial effusion on echo was causing her shortness of breath and I was asked to come back and talk to her.  Inpatient Medications    Scheduled Meds:  aspirin  81 mg Oral Daily   enoxaparin (LOVENOX) injection  40 mg Subcutaneous Q24H   sodium chloride flush  3 mL Intravenous Q12H   sodium chloride flush  3 mL Intravenous Q12H   Continuous Infusions:  sodium chloride     famotidine (PEPCID) IV     PRN Meds: sodium chloride, acetaminophen, acetaminophen, melatonin, ondansetron (ZOFRAN) IV, ondansetron (ZOFRAN) IV, polyethylene glycol, sodium chloride flush   Vital Signs    Vitals:   11/23/21 1655 11/23/21 1719 11/23/21 2124 11/24/21 0514  BP: 126/64 110/86 117/69 123/75  Pulse: 94 88 (!) 102 81  Resp:   18 16  Temp:   98.5 F (36.9 C) 97.7 F (36.5 C)  TempSrc:   Oral Oral  SpO2: 94% 97% 98% 100%  Weight:    67.3 kg  Height:        Intake/Output Summary (Last 24 hours) at 11/24/2021 1036 Last data filed at 11/23/2021 1756 Gross per 24 hour  Intake 681.58 ml  Output --  Net 681.58 ml      11/24/2021    5:14 AM 11/22/2021    3:20 PM 11/22/2021    8:32 AM  Last 3 Weights  Weight (lbs) 148 lb 5.9 oz 145 lb 145 lb 6.4 oz  Weight (kg) 67.3 kg 65.772 kg 65.953 kg      Telemetry    Normal sinus rhythm- Personally Reviewed  ECG    No new EKG to review- Personally Reviewed  Physical Exam   GEN: No acute distress.   Neck: No JVD Cardiac: RRR, no murmurs, rubs, or gallops.  Respiratory: Clear to auscultation bilaterally. GI: Soft, nontender, non-distended  MS: No  edema; No deformity. Neuro:  Nonfocal  Psych: Normal affect   Labs    High Sensitivity Troponin:   Recent Labs  Lab 11/13/21 2242 11/22/21 1526 11/23/21 0043  TROPONINIHS '3 3 3      '$ Chemistry Recent Labs  Lab 11/22/21 1526 11/23/21 0122 11/24/21 0127  NA 134*  --  133*  K 3.8  --  3.9  CL 102  --  107  CO2 20*  --  17*  GLUCOSE 103*  --  150*  BUN 11  --  11  CREATININE 0.74 0.74 0.69  CALCIUM 9.9  --  9.2  GFRNONAA >60 >60 >60  ANIONGAP 12  --  9     Hematology Recent Labs  Lab 11/22/21 1526 11/23/21 0122 11/24/21 0127  WBC 7.9 7.3 7.7  RBC 4.43 4.31 4.24  HGB 13.9 13.5 13.1  HCT 39.3 39.0 38.0  MCV 88.7 90.5 89.6  MCH 31.4 31.3 30.9  MCHC 35.4 34.6 34.5  RDW 11.8 11.9 12.0  PLT 266 262 248    BNPNo results for input(s): "BNP", "PROBNP" in the last 168 hours.   DDimer  Recent Labs  Lab 11/23/21 1804  DDIMER 0.47     CHA2DS2-VASc Score =  This indicates a  % annual risk of stroke. The patient's score is based upon:        Radiology    ECHOCARDIOGRAM COMPLETE  Result Date: 11/23/2021    ECHOCARDIOGRAM REPORT   Patient Name:   Felicia Bryant Date of Exam: 11/23/2021 Medical Rec #:  [id number]  Height:       65.5 in Accession #:    [id number] Weight:       145.0 lb Date of Birth:  1956/01/18 BSA:          1.735 m Patient Age:    66 years   BP:           114/73 mmHg Patient Gender: F          HR:           84 bpm. Exam Location:  Inpatient Procedure: 2D Echo, Cardiac Doppler and Color Doppler Indications:    Chest pain  History:        Patient has prior history of Echocardiogram examinations, most                 recent 04/05/2021.  Sonographer:    Memory Argue Referring Phys: [id number] [place]  1. Left ventricular ejection fraction, by estimation, is 60 to 65%. The left ventricle has normal function. The left ventricle has no regional wall motion abnormalities. Left ventricular diastolic parameters are consistent with Grade I  diastolic dysfunction (impaired relaxation).  2. Right ventricular systolic function is normal. The right ventricular size is normal. There is normal pulmonary artery systolic pressure. The estimated right ventricular systolic pressure is 17.6 mmHg.  3. The mitral valve is normal in structure. No evidence of mitral valve regurgitation. No evidence of mitral stenosis.  4. The aortic valve is tricuspid. Aortic valve regurgitation is not visualized. No aortic stenosis is present.  5. The inferior vena cava is normal in size with greater than 50% respiratory variability, suggesting right atrial pressure of 3 mmHg. FINDINGS  Left Ventricle: Left ventricular ejection fraction, by estimation, is 60 to 65%. The left ventricle has normal function. The left ventricle has no regional wall motion abnormalities. The left ventricular internal cavity size was normal in size. There is  no left ventricular hypertrophy. Left ventricular diastolic parameters are consistent with Grade I diastolic dysfunction (impaired relaxation). Right Ventricle: The right ventricular size is normal. No increase in right ventricular wall thickness. Right ventricular systolic function is normal. There is normal pulmonary artery systolic pressure. The tricuspid regurgitant velocity is 1.79 m/s, and  with an assumed right atrial pressure of 3 mmHg, the estimated right ventricular systolic pressure is 16.0 mmHg. Left Atrium: Left atrial size was normal in size. Right Atrium: Right atrial size was normal in size. Pericardium: Trivial pericardial effusion is present. Mitral Valve: The mitral valve is normal in structure. There is mild calcification of the mitral valve leaflet(s). Mild mitral annular calcification. No evidence of mitral valve regurgitation. No evidence of mitral valve stenosis. Tricuspid Valve: The tricuspid valve is normal in structure. Tricuspid valve regurgitation is trivial. Aortic Valve: The aortic valve is tricuspid. Aortic valve  regurgitation is not visualized. No aortic stenosis is present. Aortic valve mean gradient measures 3.0 mmHg. Aortic valve peak gradient measures 5.6 mmHg. Aortic valve area, by VTI measures 2.57 cm. Pulmonic Valve: The pulmonic valve was normal in structure. Pulmonic valve regurgitation is not visualized. Aorta: The aortic root is normal in size and structure. Venous: The inferior vena cava is  normal in size with greater than 50% respiratory variability, suggesting right atrial pressure of 3 mmHg. IAS/Shunts: No atrial level shunt detected by color flow Doppler.  LEFT VENTRICLE PLAX 2D LVIDd:         4.30 cm   Diastology LVIDs:         2.80 cm   LV e' medial:    8.08 cm/s LV PW:         0.80 cm   LV E/e' medial:  7.5 LV IVS:        0.90 cm   LV e' lateral:   12.00 cm/s LVOT diam:     2.00 cm   LV E/e' lateral: 5.0 LV SV:         52 LV SV Index:   30 LVOT Area:     3.14 cm  RIGHT VENTRICLE TAPSE (M-mode): 2.0 cm LEFT ATRIUM             Index        RIGHT ATRIUM           Index LA Vol (A2C):   30.5 ml 17.58 ml/m  RA Area:     10.60 cm LA Vol (A4C):   38.9 ml 22.42 ml/m  RA Volume:   21.80 ml  12.56 ml/m LA Biplane Vol: 36.3 ml 20.92 ml/m  AORTIC VALVE AV Area (Vmax):    2.72 cm AV Area (Vmean):   2.57 cm AV Area (VTI):     2.57 cm AV Vmax:           118.00 cm/s AV Vmean:          79.600 cm/s AV VTI:            0.203 m AV Peak Grad:      5.6 mmHg AV Mean Grad:      3.0 mmHg LVOT Vmax:         102.00 cm/s LVOT Vmean:        65.000 cm/s LVOT VTI:          0.166 m LVOT/AV VTI ratio: 0.82  AORTA Ao Root diam: 3.20 cm Ao Asc diam:  3.30 cm MITRAL VALVE               TRICUSPID VALVE MV Area (PHT): 4.89 cm    TR Peak grad:   12.8 mmHg MV Decel Time: 155 msec    TR Vmax:        179.00 cm/s MV E velocity: 60.50 cm/s MV A velocity: 66.50 cm/s  SHUNTS MV E/A ratio:  0.91        Systemic VTI:  0.17 m                            Systemic Diam: 2.00 cm Dalton McleanMD Electronically signed by Franki Monte Signature  Date/Time: 11/23/2021/3:17:45 PM    Final    CARDIAC CATHETERIZATION  Result Date: 11/23/2021   Prox LAD to Mid LAD lesion is 5% stenosed.   The left ventricular ejection fraction is 55-65% by visual estimate. No significant coronary obstructive disease with minimal 5% plaque in the proximal LAD. Normal small ramus intermediate, left circumflex and right dominant RCA. Normal LV function with EF estimated 55 to 60%.  LVEDP 7 mm. RECOMMENDATION: Suspect nonischemic chest pain.  Consider possible pulmonary evaluation for shortness of breath.   Cardiac Studies   2D echo 11/23/2021 IMPRESSIONS    1. Left ventricular ejection fraction,  by estimation, is 60 to 65%. The  left ventricle has normal function. The left ventricle has no regional  wall motion abnormalities. Left ventricular diastolic parameters are  consistent with Grade I diastolic  dysfunction (impaired relaxation).   2. Right ventricular systolic function is normal. The right ventricular  size is normal. There is normal pulmonary artery systolic pressure. The  estimated right ventricular systolic pressure is 62.2 mmHg.   3. The mitral valve is normal in structure. No evidence of mitral valve  regurgitation. No evidence of mitral stenosis.   4. The aortic valve is tricuspid. Aortic valve regurgitation is not  visualized. No aortic stenosis is present.   5. The inferior vena cava is normal in size with greater than 50%  respiratory variability, suggesting right atrial pressure of 3 mmHg.   Cardiac cath 11/23/2021 Conclusion     Prox LAD to Mid LAD lesion is 5% stenosed.   The left ventricular ejection fraction is 55-65% by visual estimate.   No significant coronary obstructive disease with minimal 5% plaque in the proximal LAD.   Normal small ramus intermediate, left circumflex and right dominant RCA.   Normal LV function with EF estimated 55 to 60%.  LVEDP 7 mm.   RECOMMENDATION: Suspect nonischemic chest pain.  Consider  possible pulmonary evaluation for shortness of breath.  Patient Profile     66 y.o. female  hx of asthma, palpitations following epinephrine for dental procedure and uterine cancer now presents with chest pain and SOB, scheduled for stress test Friday after office visit presented to ER for symptom progression.    Assessment & Plan    Noncardiac chest pain -Essentially normal coronary arteries on cath with 5% plaque in the proximal to mid LAD -Troponin normal -2D echo with normal LV function  Dyspnea on exertion -Unclear etiology suspect either related to her asthma or anxiety -Cath as above with essentially normal coronary arteries -2D echo normal with 1 diastolic dysfunction normal LV filling pressure of 7 mmHg on cath -D-dimer normal -Shortness of breath is not cardiac in origin and recommend pulmonary work-up outpatient  Trivial pericardial effusion -I explained to the patient that this is not causing her shortness of breath as it is trivial and a common finding on echo no further work-up is necessary at this time and I do not think this is causing her chest pain either as her chest pain is very atypical and pericardial effusions typically cause sharp pain and not a squeezing sensation with shortness of breath do not cause exertional chest pain only and usually cause pain that is worse lying supine and better when sitting up and moving around  [facility] will sign off.   Medication Recommendations: Aspirin 81 mg daily Other recommendations (labs, testing, etc): None Follow up as an outpatient: Dr. Marlou Porch in 2 to 3 weeks  For questions or updates, please contact [place] Please consult [email or web address] for contact info under        Signed, Fransico Him, MD  11/24/2021, 10:36 AM

## 2021-11-24 NOTE — Care Management Obs Status (Signed)
[place] NOTIFICATION   Patient Details  Name: Porshia Blizzard MRN: [id number] Date of Birth: 1955/07/19   Medicare Observation Status Notification Given:  Yes    Bethena Roys, RN 11/24/2021, 11:02 AM

## 2021-11-25 ENCOUNTER — Telehealth: Payer: Self-pay | Admitting: Cardiology

## 2021-11-25 ENCOUNTER — Encounter (HOSPITAL_COMMUNITY): Payer: 59

## 2021-11-25 LAB — LIPOPROTEIN A (LPA): Lipoprotein (a): 201.6 nmol/L — ABNORMAL HIGH (ref <75.0)

## 2021-11-25 NOTE — Telephone Encounter (Signed)
Patient is have sharp stinging pain in chest area where catherization was put in. Patient wants to know if its normal or not. ?

## 2021-11-25 NOTE — Telephone Encounter (Signed)
Called patient back about message. Patient complaining of chest pain that is stinging on the left side of the chest near the middle of the sternum. Patient has follow-up with pulmonology next week. Consulted, Dr. Marlou Porch, who stated patient's heart is fine and that she could go without seeing a cardiologist, but she needs to see pulmonology. Per Dr. Marlou Porch, Patient's chest pain is not related to her heart. Called patient back with recommendations. Patient verbalized understanding.

## 2021-12-01 ENCOUNTER — Ambulatory Visit: Payer: 59 | Admitting: Pulmonary Disease

## 2021-12-01 ENCOUNTER — Other Ambulatory Visit: Payer: Self-pay

## 2021-12-01 ENCOUNTER — Encounter: Payer: Self-pay | Admitting: Pulmonary Disease

## 2021-12-01 VITALS — BP 112/72 | HR 74 | Temp 97.7°F | Ht 66.0 in | Wt 145.8 lb

## 2021-12-01 DIAGNOSIS — R0609 Other forms of dyspnea: Secondary | ICD-10-CM

## 2021-12-01 DIAGNOSIS — R079 Chest pain, unspecified: Secondary | ICD-10-CM | POA: Diagnosis not present

## 2021-12-01 MED ORDER — ALBUTEROL SULFATE HFA 108 (90 BASE) MCG/ACT IN AERS: 1.0000 | INHALATION_SPRAY | RESPIRATORY_TRACT | 3 refills | Status: AC | PRN

## 2021-12-01 MED ORDER — ALBUTEROL SULFATE 2.5 MG/0.5ML IN NEBU: 2.5000 mg | INHALATION_SOLUTION | Freq: Two times a day (BID) | RESPIRATORY_TRACT | 3 refills | Status: AC | PRN

## 2021-12-01 NOTE — Progress Notes (Signed)
Subjective:    Patient ID: Felicia Bryant, female    DOB: April 17, 1955, 66 y.o.   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 She had episodic sudden onset shortness of breath and substernal chest burning and heaviness that started a month ago.  She had 2 ED visits and then required another hospitalization on 10/3 due to increased frequency and intensity of symptoms.  She underwent echocardiogram that was normal and left heart catheterization which was normal.  She reports history of "asthma" after she had cholecystectomy in 2003 and attributes it to a traumatic intubation.  She apparently saw ENT Dr. Janace Hoard after this was attributed to reflux.  Since then she has been hypersensitive to odors. She works as an Investment banker, corporate in class and tries to avoid chemicals. She has changed to a plant food-based diet She is concerned about increased sensitivity to albuterol. Over the years she has increased nebulizer concentration from 0.6-2.5 because she felt that lower concentration was not effective.  She states that she prayed to God and had a miracle about 2 days ago when her symptoms completely disappeared and she has not had recurrence of these episodes since then Chest x-ray 10/2021 and 01/2020 reported normal  Spirometry performed today does not show any evidence of airway obstruction  Significant tests/ events reviewed  LHC 11/2021 no sig CAD Echo 11/2021 nml VEF, Nml  RVSP   Past Medical History:  Diagnosis Date   Asthma    Complication of anesthesia    pt states i have had trouble with my throat closing after i had a tube down my throat before."   Palpitations    seen Dr.Skains in 2009..holter with pac's   Palpitations    echocardiogram in 2330 with diastolic dysfunction EF 57 %   Seizures ([place])    had seizure as child from vaccines and has had not seizures since was a child'.    Past Surgical  History:  Procedure Laterality Date   CATARACT EXTRACTION W/PHACO Right 11/30/2014   Procedure: CATARACT EXTRACTION PHACO AND INTRAOCULAR LENS PLACEMENT (IOC);  Surgeon: Tonny Branch, MD;  Location: [facility];  Service: Ophthalmology;  Laterality: Right;  CDE:10.59   CATARACT EXTRACTION W/PHACO Left 03/04/2018   Procedure: CATARACT EXTRACTION PHACO AND INTRAOCULAR LENS PLACEMENT (IOC);  Surgeon: Tonny Branch, MD;  Location: [facility];  Service: Ophthalmology;  Laterality: Left;  CDE: 8.40   CHOLECYSTECTOMY     conization of cervix     DILATION AND CURETTAGE OF UTERUS     episiotomy repair also.   LEFT HEART CATH AND CORONARY ANGIOGRAPHY N/A 11/23/2021   Procedure: LEFT HEART CATH AND CORONARY ANGIOGRAPHY;  Surgeon: Troy Sine, MD;  Location: [facility];  Service: Cardiovascular;  Laterality: N/A;   TONSILLECTOMY      Allergies  Allergen Reactions   Benadryl [Diphenhydramine Hcl (Sleep)] Shortness Of Breath    iv   Haemophilus B Polysaccharide Vaccine     Other reaction(s): Other (See Comments) seizures   Promethazine Nausea And Vomiting   Vitamin D Analogs Nausea Only    If given in a high dose   Ciprofloxacin Other (See Comments)    Pain in stomach   Codeine Other (See Comments)    Pt reports "It makes me having panting breathing and my feet and hands go numb"   Fluzone [Influenza Virus Vaccine] Other (See Comments)    Heart Racing/Passed out.  Iodinated Contrast Media Other (See Comments)    Heart Palpatations  only   Iodine Nausea Only   Levaquin [Levofloxacin] Other (See Comments)    Heart rate 120's Tendon pops   Macrobid [Nitrofurantoin Macrocrystal] Nausea And Vomiting   Percocet [Oxycodone-Acetaminophen] Other (See Comments)    Pt reports "It makes me having panting breathing and my feet and hands go numb"   Amoxicillin Rash   Bactrim [Sulfamethoxazole-Trimethoprim] Rash   Depo-Medrol [Methylprednisolone Sodium Succ] Palpitations    Racing heart, and flushing    Keflex [Cephalexin] Rash   Metrogel [Metronidazole] Palpitations    Social History   Socioeconomic History   Marital status: Married    Spouse name: Not on file   Number of children: Not on file   Years of education: Not on file   Highest education level: Not on file  Occupational History   Not on file  Tobacco Use   Smoking status: Never    Passive exposure: Never   Smokeless tobacco: Never  Vaping Use   Vaping Use: Never used  Substance and Sexual Activity   Alcohol use: No   Drug use: No   Sexual activity: Yes    Birth control/protection: None, Post-menopausal  Other Topics Concern   Not on file  Social History Narrative   Not on file   Social Determinants of Health   Financial Resource Strain: Not on file  Food Insecurity: Not on file  Transportation Needs: Not on file  Physical Activity: Not on file  Stress: Not on file  Social Connections: Not on file  Intimate Partner Violence: Not on file    Family History  Problem Relation Age of Onset   Dementia Mother    Heart attack Father    Stroke Neg Hx      Review of Systems Constitutional: negative for anorexia, fevers and sweats  Eyes: negative for irritation, redness and visual disturbance  Ears, nose, mouth, throat, and face: negative for earaches, epistaxis, nasal congestion and sore throat  Respiratory: negative for cough, dyspnea on exertion, sputum and wheezing  Cardiovascular: negative for chest pain, dyspnea, lower extremity edema, orthopnea, palpitations and syncope  Gastrointestinal: negative for abdominal pain, constipation, diarrhea, melena, nausea and vomiting  Genitourinary:negative for dysuria, frequency and hematuria  Hematologic/lymphatic: negative for bleeding, easy bruising and lymphadenopathy  Musculoskeletal:negative for arthralgias, muscle weakness and stiff joints  Neurological: negative for coordination problems, gait problems, headaches and weakness  Endocrine: negative for  diabetic symptoms including polydipsia, polyuria and weight loss     Objective:   Physical Exam  Gen. Pleasant, well-nourished, in no distress, normal affect ENT - no pallor,icterus, no post nasal drip Neck: No JVD, no thyromegaly, no carotid bruits Lungs: no use of accessory muscles, no dullness to percussion, clear without rales or rhonchi  Cardiovascular: Rhythm regular, heart sounds  normal, no murmurs or gallops, no peripheral edema Abdomen: soft and non-tender, no hepatosplenomegaly, BS normal. Musculoskeletal: No deformities, no cyanosis or clubbing Neuro:  alert, non focal       Assessment & Plan:

## 2021-12-01 NOTE — Patient Instructions (Signed)
  X Refill on albuterol MDI & neb

## 2021-12-01 NOTE — Assessment & Plan Note (Signed)
No clear cause identified, does not appear typical for asthma or reactive airway disease. Cardiac etiology has been ruled out. She does not report a clear stressor to attribute this to anxiety/panic attacks. Spirometry appears normal today she is a never smoker so I doubt any other form of lung disease.  Chest x-ray does not show any evidence of ILD.  Regarding her previous history of asthma related to intubation, her history is not typical for tracheal stenosis.  I offered her CT neck to further investigate or ENT evaluation and she would like to defer this for now.  We will provide a refill for albuterol nebs and also give her a prescription for albuterol MDI to take for an emergency

## 2021-12-09 ENCOUNTER — Ambulatory Visit: Payer: Medicare Other | Attending: Cardiology | Admitting: Cardiology

## 2021-12-09 ENCOUNTER — Encounter: Payer: Self-pay | Admitting: Cardiology

## 2021-12-09 VITALS — BP 128/74 | HR 78 | Ht 66.0 in | Wt 150.8 lb

## 2021-12-09 DIAGNOSIS — R0789 Other chest pain: Secondary | ICD-10-CM | POA: Diagnosis not present

## 2021-12-09 NOTE — Patient Instructions (Signed)
Medication Instructions:  The current medical regimen is effective;  continue present plan and medications.  *If you need a refill on your cardiac medications before your next appointment, please call your pharmacy*  Follow-Up: At [facility], you and your health needs are our priority.  As part of our continuing mission to provide you with exceptional heart care, we have created designated Provider Care Teams.  These Care Teams include your primary Cardiologist (physician) and Advanced Practice Providers (APPs -  Physician Assistants and Nurse Practitioners) who all work together to provide you with the care you need, when you need it.  We recommend signing up for the patient portal called "MyChart".  Sign up information is provided on this After Visit Summary.  MyChart is used to connect with patients for Virtual Visits (Telemedicine).  Patients are able to view lab/test results, encounter notes, upcoming appointments, etc.  Non-urgent messages can be sent to your provider as well.   To learn more about what you can do with MyChart, go to [email or web address].    Your next appointment:   Follow up as needed.  Important Information About Sugar

## 2021-12-09 NOTE — Progress Notes (Signed)
Cardiology Office Note:    Date:  12/09/2021   ID:  Felicia Bryant, DOB [birth year], MRN [id number]  PCP:  Sharilyn Sites, [place]  Cardiologist:  Candee Furbish, MD  Advanced Practice Provider:  No care team member to display Electrophysiologist:  None       Referring MD: Sharilyn Sites, MD    History of Present Illness:    Felicia Bryant is a 66 y.o. female here for the follow-up discussion of aortic sclerosis noted on echocardiogram as well as recent chest pain/racing heartbeat/palpitations episode following epinephrine utilized for dental procedure.  She was admitted to the hospital 11/22/2021. She complained of progressively worsening exertional dyspnea and intermittent chest tightness of 10 days duration. High-sensitivity troponin negative x1. EKG revealed no evidence of acute ischemia. She underwent cardiac cath which showed 5% plaque and mild LAD, otherwise essentially normal coronaries. Echo revealed preserved EF, grade 1 diastolic dysfunction, trivial pericardial effusion. D-dimer obtained was normal. He was recommended to continue aspirin '81mg'$  daily. She was discharged in stable condition and recommended cardiology and pulmonary follow up.  Emergency department on 02/12/2020 with precordial pain.  Today, she says she is doing much better.   She recalls that prior to her hospital admission, she walked into her kitchen and felt a tight sensation in her chest. She took an aspirin and became heated and short of breath.   She reports that about 8 months to 1 year ago and suffered a mechanical fall. She states that she felt a slight tear in her neck at that time.   All three of her mother's siblings are heading towards 31 years old and doing well.  She denies any palpitations, chest pain, shortness of breath, or peripheral edema. No lightheadedness, headaches, syncope, orthopnea, or PND.  Past Medical History:  Diagnosis Date   Asthma    Complication of  anesthesia    pt states i have had trouble with my throat closing after i had a tube down my throat before."   Palpitations    seen Dr.Kory Panjwani in 2009..holter with pac's   Palpitations    echocardiogram in 1517 with diastolic dysfunction EF 57 %   Seizures ([place])    had seizure as child from vaccines and has had not seizures since was a child'.    Past Surgical History:  Procedure Laterality Date   CATARACT EXTRACTION W/PHACO Right 11/30/2014   Procedure: CATARACT EXTRACTION PHACO AND INTRAOCULAR LENS PLACEMENT (IOC);  Surgeon: Tonny Branch, MD;  Location: [facility];  Service: Ophthalmology;  Laterality: Right;  CDE:10.59   CATARACT EXTRACTION W/PHACO Left 03/04/2018   Procedure: CATARACT EXTRACTION PHACO AND INTRAOCULAR LENS PLACEMENT (IOC);  Surgeon: Tonny Branch, MD;  Location: [facility];  Service: Ophthalmology;  Laterality: Left;  CDE: 8.40   CHOLECYSTECTOMY     conization of cervix     DILATION AND CURETTAGE OF UTERUS     episiotomy repair also.   LEFT HEART CATH AND CORONARY ANGIOGRAPHY N/A 11/23/2021   Procedure: LEFT HEART CATH AND CORONARY ANGIOGRAPHY;  Surgeon: Troy Sine, MD;  Location: [facility];  Service: Cardiovascular;  Laterality: N/A;   TONSILLECTOMY      Current Medications: Current Meds  Medication Sig   albuterol (VENTOLIN HFA) 108 (90 Base) MCG/ACT inhaler Inhale 1-2 puffs into the lungs as needed for wheezing or shortness of breath.   Albuterol Sulfate 2.5 MG/0.5ML NEBU Inhale 0.5 mLs (2.5 mg total) into the lungs 2 (two) times daily  as needed (for shortness of breath or wheezing).   Cholecalciferol (VITAMIN D PO) Take 2,000 Units by mouth daily.   Methylcobalamin 1000 MCG SUBL Place 1,000 mcg under the tongue 4 (four) times a week.   OVER THE COUNTER MEDICATION Take 14 drops by mouth daily. Algae based Omega 3     Allergies:   Benadryl [diphenhydramine hcl (sleep)], Haemophilus b polysaccharide vaccine, Promethazine, Vitamin d analogs, Ciprofloxacin,  Codeine, Fluzone [influenza virus vaccine], Iodinated contrast media, Iodine, Levaquin [levofloxacin], Macrobid [nitrofurantoin macrocrystal], Percocet [oxycodone-acetaminophen], Amoxicillin, Bactrim [sulfamethoxazole-trimethoprim], Depo-medrol [methylprednisolone sodium succ], Keflex [cephalexin], and Metrogel [metronidazole]   Social History   Socioeconomic History   Marital status: Married    Spouse name: Not on file   Number of children: Not on file   Years of education: Not on file   Highest education level: Not on file  Occupational History   Not on file  Tobacco Use   Smoking status: Never    Passive exposure: Never   Smokeless tobacco: Never  Vaping Use   Vaping Use: Never used  Substance and Sexual Activity   Alcohol use: No   Drug use: No   Sexual activity: Yes    Birth control/protection: None, Post-menopausal  Other Topics Concern   Not on file  Social History Narrative   Not on file   Social Determinants of Health   Financial Resource Strain: Not on file  Food Insecurity: Not on file  Transportation Needs: Not on file  Physical Activity: Not on file  Stress: Not on file  Social Connections: Not on file     Family History: The patient's family history includes Dementia in her mother; Heart attack in her father. There is no history of Stroke.  ROS:   Please see the history of present illness.    All other systems reviewed and are negative.  EKGs/Labs/Other Studies Reviewed:    The following studies were reviewed today: Prior echocardiograms were reviewed.  Left Heart Cath 11/23/2021:   Prox LAD to Mid LAD lesion is 5% stenosed.   The left ventricular ejection fraction is 55-65% by visual estimate.   No significant coronary obstructive disease with minimal 5% plaque in the proximal LAD.   Normal small ramus intermediate, left circumflex and right dominant RCA.   Normal LV function with EF estimated 55 to 60%.  LVEDP 7 mm.   RECOMMENDATION: Suspect  nonischemic chest pain.  Consider possible pulmonary evaluation for shortness of breath.   Echo 11/23/2021: 1. Left ventricular ejection fraction, by estimation, is 60 to 65%. The  left ventricle has normal function. The left ventricle has no regional  wall motion abnormalities. Left ventricular diastolic parameters are  consistent with Grade I diastolic  dysfunction (impaired relaxation).   2. Right ventricular systolic function is normal. The right ventricular  size is normal. There is normal pulmonary artery systolic pressure. The  estimated right ventricular systolic pressure is 22.6 mmHg.   3. The mitral valve is normal in structure. No evidence of mitral valve  regurgitation. No evidence of mitral stenosis.   4. The aortic valve is tricuspid. Aortic valve regurgitation is not  visualized. No aortic stenosis is present.   5. The inferior vena cava is normal in size with greater than 50%  respiratory variability, suggesting right atrial pressure of 3 mmHg.     EKG: EKG is personally reviewed. 12/09/21: EKG was not ordered.   Recent Labs: 11/13/2021: ALT 13; TSH 2.996 11/23/2021: Magnesium 2.0 11/24/2021: BUN 11; Creatinine, Ser  0.69; Hemoglobin 13.1; Platelets 248; Potassium 3.9; Sodium 133  Recent Lipid Panel    Component Value Date/Time   CHOL 190 11/24/2021 0127   TRIG 20 11/24/2021 0127   HDL 83 11/24/2021 0127   CHOLHDL 2.3 11/24/2021 0127   VLDL 4 11/24/2021 0127   LDLCALC 103 (H) 11/24/2021 0127     Risk Assessment/Calculations:      Physical Exam:    VS:  BP 128/74   Pulse 78   Ht '5\' 6"'$  (1.676 m)   Wt 150 lb 12.8 oz (68.4 kg)   SpO2 98%   BMI 24.34 kg/m     Wt Readings from Last 3 Encounters:  12/09/21 150 lb 12.8 oz (68.4 kg)  12/01/21 145 lb 12.8 oz (66.1 kg)  11/24/21 148 lb 5.9 oz (67.3 kg)     GEN:  Well nourished, well developed in no acute distress HEENT: Normal NECK: No JVD; No carotid bruits LYMPHATICS: No lymphadenopathy CARDIAC: RRR,  no murmurs, rubs, gallops RESPIRATORY:  Clear to auscultation without rales, wheezing or rhonchi  ABDOMEN: Soft, non-tender, non-distended MUSCULOSKELETAL:  No edema; No deformity  SKIN: Warm and dry NEUROLOGIC:  Alert and oriented x 3 PSYCHIATRIC:  Normal affect   ASSESSMENT:    1. Chest discomfort     PLAN:    In order of problems listed above:  Chest discomfort - Has elevated LP(a) 200, LDL cholesterol is 103.  Thankfully however, she has had a heart catheterization with essentially 0% stenosis.  She also has an echocardiogram with normal function.  Her troponins were normal.  Echocardiogram was read out as trivial pericardial effusion however it was likely physiologic meaning that everybody has some amount of fluid to help lubricate.  Discussed with she and her husband.  Very thankful for reassuring results.  Obviously if a targeted treatment for LP(a) occurs in the future we can always consider.  I would consider an aspirin perhaps every other day since she is having some stomach irritation with it.  She is also on a full plant-based diet that is low in inflammation.  Excellent.  Follow up: PRN.  Medication Adjustments/Labs and Tests Ordered: Current medicines are reviewed at length with the patient today.  Concerns regarding medicines are outlined above.  No orders of the defined types were placed in this encounter.  No orders of the defined types were placed in this encounter.   Patient Instructions  Medication Instructions:  The current medical regimen is effective;  continue present plan and medications.  *If you need a refill on your cardiac medications before your next appointment, please call your pharmacy*  Follow-Up: At [facility], you and your health needs are our priority.  As part of our continuing mission to provide you with exceptional heart care, we have created designated Provider Care Teams.  These Care Teams include your primary Cardiologist  (physician) and Advanced Practice Providers (APPs -  Physician Assistants and Nurse Practitioners) who all work together to provide you with the care you need, when you need it.  We recommend signing up for the patient portal called "MyChart".  Sign up information is provided on this After Visit Summary.  MyChart is used to connect with patients for Virtual Visits (Telemedicine).  Patients are able to view lab/test results, encounter notes, upcoming appointments, etc.  Non-urgent messages can be sent to your provider as well.   To learn more about what you can do with MyChart, go to [email or web address].    Your next appointment:  Follow up as needed.  Important Information About Sugar          I,Breanna Adamick,acting as a scribe for Candee Furbish, MD.,have documented all relevant documentation on the behalf of Candee Furbish, MD,as directed by  Candee Furbish, MD while in the presence of Candee Furbish, MD.  I, Candee Furbish, MD, have reviewed all documentation for this visit. The documentation on 12/09/21 for the exam, diagnosis, procedures, and orders are all accurate and complete.   Signed, Candee Furbish, MD  12/09/2021 9:58 AM    Felicia Bryant
